# Patient Record
Sex: Male | Born: 1970
Health system: Southern US, Community
[De-identification: ages and names within clinical notes are randomized; demographics above are authoritative.]

## PROBLEM LIST (undated history)

## (undated) DIAGNOSIS — G919 Hydrocephalus, unspecified: Secondary | ICD-10-CM

## (undated) DIAGNOSIS — B1920 Unspecified viral hepatitis C without hepatic coma: Secondary | ICD-10-CM

## (undated) DIAGNOSIS — K219 Gastro-esophageal reflux disease without esophagitis: Secondary | ICD-10-CM

## (undated) DIAGNOSIS — G43909 Migraine, unspecified, not intractable, without status migrainosus: Secondary | ICD-10-CM

## (undated) HISTORY — DX: Migraine, unspecified, not intractable, without status migrainosus: G43.909

## (undated) HISTORY — DX: Gastro-esophageal reflux disease without esophagitis: K21.9

## (undated) HISTORY — DX: Unspecified viral hepatitis C without hepatic coma: B19.20

## (undated) HISTORY — PX: OTHER SURGICAL HISTORY: SHX169

---

## 1999-06-16 ENCOUNTER — Ambulatory Visit (HOSPITAL_COMMUNITY): Admission: RE | Admit: 1999-06-16 | Discharge: 1999-06-16 | Payer: Self-pay | Admitting: *Deleted

## 2000-12-07 ENCOUNTER — Encounter (INDEPENDENT_AMBULATORY_CARE_PROVIDER_SITE_OTHER): Payer: Self-pay | Admitting: *Deleted

## 2000-12-07 ENCOUNTER — Ambulatory Visit: Admission: RE | Admit: 2000-12-07 | Discharge: 2000-12-07 | Payer: Self-pay | Admitting: *Deleted

## 2002-12-19 ENCOUNTER — Encounter: Payer: Self-pay | Admitting: Neurosurgery

## 2002-12-19 ENCOUNTER — Encounter: Admission: RE | Admit: 2002-12-19 | Discharge: 2002-12-19 | Payer: Self-pay | Admitting: Neurosurgery

## 2009-12-17 ENCOUNTER — Encounter: Admission: RE | Admit: 2009-12-17 | Discharge: 2009-12-17 | Payer: Self-pay | Admitting: Gastroenterology

## 2010-11-13 NOTE — Procedures (Signed)
[place]  Patient:    Carlos Costa, Carlos Costa                       MRN: [id number] Proc. Date: 12/07/00 Attending:  Roosvelt Harps, M.D. CC:         Lilly Cove, M.D.   Procedure Report  PROCEDURE:  Liver biopsy.  INDICATIONS FOR PROCEDURE:  Post treatment assessment of hepatitis C.  PREPARATION:  The patient was in the supine position with his right arm below his head. The right side in the low intercostal mid axillary line area was prepped with Betadine. The 1% xylocaine was infiltrated over the rib margin. A 0.5 cm incision was made in the skin and a #16 Menghini needle was used to obtain a fair sized core of liver tissue with one pass with the patient holding his breath and expiration. He tolerated the procedure well. A sterile pressure dressing was applied and he was placed on his right side for two hours. Vital signs will be monitored every 15 minutes. He will be observed for the next four hours and discharged if stable.  IMPRESSION:  Successful liver biopsy.  PLAN:  The patient will return to the office for follow-up discussion of results. DD:  12/07/00 TD:  12/07/00 Job: [id number]

## 2012-11-24 ENCOUNTER — Ambulatory Visit (INDEPENDENT_AMBULATORY_CARE_PROVIDER_SITE_OTHER): Payer: Managed Care, Other (non HMO) | Admitting: Internal Medicine

## 2012-11-24 VITALS — BP 102/72 | HR 71 | Temp 98.0°F | Resp 16 | Ht 65.5 in | Wt 172.0 lb

## 2012-11-24 DIAGNOSIS — B1920 Unspecified viral hepatitis C without hepatic coma: Secondary | ICD-10-CM

## 2012-11-24 DIAGNOSIS — G911 Obstructive hydrocephalus: Secondary | ICD-10-CM

## 2012-11-24 DIAGNOSIS — Z800 Family history of malignant neoplasm of digestive organs: Secondary | ICD-10-CM

## 2012-11-24 DIAGNOSIS — Z0000 Encounter for general adult medical examination without abnormal findings: Secondary | ICD-10-CM

## 2012-11-24 LAB — COMPREHENSIVE METABOLIC PANEL
AST: 17 U/L (ref 0–37)
Chloride: 105 mEq/L (ref 96–112)
Creat: 0.98 mg/dL (ref 0.50–1.35)
Glucose, Bld: 101 mg/dL — ABNORMAL HIGH (ref 70–99)
Potassium: 4.6 mEq/L (ref 3.5–5.3)
Total Protein: 7.2 g/dL (ref 6.0–8.3)

## 2012-11-24 LAB — POCT URINALYSIS DIPSTICK
Bilirubin, UA: NEGATIVE
Ketones, UA: NEGATIVE
Leukocytes, UA: NEGATIVE
pH, UA: 7

## 2012-11-24 LAB — POCT CBC
Lymph, poc: 2.2 (ref 0.6–3.4)
MID (cbc): 0.5 (ref 0–0.9)
POC Granulocyte: 4.5 (ref 2–6.9)
POC LYMPH PERCENT: 30.6 %L (ref 10–50)
POC MID %: 7.4 %M (ref 0–12)
WBC: 7.2 10*3/uL (ref 4.6–10.2)

## 2012-11-24 LAB — LIPID PANEL
Cholesterol: 179 mg/dL (ref 0–200)
Total CHOL/HDL Ratio: 3 Ratio
Triglycerides: 141 mg/dL (ref <150)
VLDL: 28 mg/dL (ref 0–40)

## 2012-11-24 LAB — POCT UA - MICROSCOPIC ONLY
Bacteria, U Microscopic: NEGATIVE
Mucus, UA: NEGATIVE
RBC, urine, microscopic: NEGATIVE
WBC, Ur, HPF, POC: NEGATIVE

## 2012-11-24 NOTE — Patient Instructions (Signed)
DASH Diet  The DASH diet stands for "Dietary Approaches to Stop Hypertension." It is a healthy eating plan that has been shown to reduce high blood pressure (hypertension) in as little as 14 days, while also possibly providing other significant health benefits. These other health benefits include reducing the risk of breast cancer after menopause and reducing the risk of type 2 diabetes, heart disease, colon cancer, and stroke. Health benefits also include weight loss and slowing kidney failure in patients with chronic kidney disease.   DIET GUIDELINES  · Limit salt (sodium). Your diet should contain less than 1500 mg of sodium daily.  · Limit refined or processed carbohydrates. Your diet should include mostly whole grains. Desserts and added sugars should be used sparingly.  · Include small amounts of heart-healthy fats. These types of fats include nuts, oils, and tub margarine. Limit saturated and trans fats. These fats have been shown to be harmful in the body.  CHOOSING FOODS   The following food groups are based on a 2000 calorie diet. See your Registered Dietitian for individual calorie needs.  Grains and Grain Products (6 to 8 servings daily)  · Eat More Often: Whole-wheat bread, brown rice, whole-grain or wheat pasta, quinoa, popcorn without added fat or salt (air popped).  · Eat Less Often: White bread, white pasta, white rice, cornbread.  Vegetables (4 to 5 servings daily)  · Eat More Often: Fresh, frozen, and canned vegetables. Vegetables may be raw, steamed, roasted, or grilled with a minimal amount of fat.  · Eat Less Often/Avoid: Creamed or fried vegetables. Vegetables in a cheese sauce.  Fruit (4 to 5 servings daily)  · Eat More Often: All fresh, canned (in natural juice), or frozen fruits. Dried fruits without added sugar. One hundred percent fruit juice (½ cup [237 mL] daily).  · Eat Less Often: Dried fruits with added sugar. Canned fruit in light or heavy syrup.  Lean Meats, Fish, and Poultry (2  servings or less daily. One serving is 3 to 4 oz [85-114 g]).  · Eat More Often: Ninety percent or leaner ground beef, tenderloin, sirloin. Round cuts of beef, chicken breast, turkey breast. All fish. Grill, bake, or broil your meat. Nothing should be fried.  · Eat Less Often/Avoid: Fatty cuts of meat, turkey, or chicken leg, thigh, or wing. Fried cuts of meat or fish.  Dairy (2 to 3 servings)  · Eat More Often: Low-fat or fat-free milk, low-fat plain or light yogurt, reduced-fat or part-skim cheese.  · Eat Less Often/Avoid: Milk (whole, 2%). Whole milk yogurt. Full-fat cheeses.  Nuts, Seeds, and Legumes (4 to 5 servings per week)  · Eat More Often: All without added salt.  · Eat Less Often/Avoid: Salted nuts and seeds, canned beans with added salt.  Fats and Sweets (limited)  · Eat More Often: Vegetable oils, tub margarines without trans fats, sugar-free gelatin. Mayonnaise and salad dressings.  · Eat Less Often/Avoid: Coconut oils, palm oils, butter, stick margarine, cream, half and half, cookies, candy, pie.  FOR MORE INFORMATION  The Dash Diet Eating Plan: www.dashdiet.org  Document Released: 06/03/2011 Document Revised: 09/06/2011 Document Reviewed: 06/03/2011  Exit[employer]® Patient Information ©2014 [employer].

## 2012-11-24 NOTE — Progress Notes (Signed)
  Subjective:    Patient ID: Carlos Costa, male    DOB: [birth year], 42 y.o.   MRN: [id number]  HPI    Review of Systems  Constitutional: Negative.   HENT: Negative.   Eyes: Negative.   Respiratory: Negative.   Cardiovascular: Negative.   Gastrointestinal: Negative.   Endocrine: Negative.   Genitourinary: Negative.   Musculoskeletal: Negative.   Skin: Negative.   Allergic/Immunologic: Negative.   Neurological: Negative.   Hematological: Negative.   Psychiatric/Behavioral: Negative.        Objective:   Physical Exam        Assessment & Plan:

## 2012-11-24 NOTE — Progress Notes (Signed)
Subjective:    Patient ID: Carlos Costa, male    DOB: 1971-03-15, 42 y.o.   MRN: [id number]  HPI Feels great, continues losing weight. On no meds  No problems   Review of Systems  Constitutional: Negative.   HENT: Negative.   Eyes: Negative.   Respiratory: Negative.   Cardiovascular: Negative.   Gastrointestinal: Negative.   Endocrine: Negative.   Genitourinary: Negative.   Allergic/Immunologic: Negative.   Neurological: Negative.   Hematological: Negative.   Psychiatric/Behavioral: Negative.        Objective:   Physical Exam  Vitals reviewed. Constitutional: He is oriented to person, place, and time. He appears well-developed and well-nourished.  HENT:  Right Ear: External ear normal.  Left Ear: External ear normal.  Nose: Nose normal.  Mouth/Throat: Oropharynx is clear and moist.  Eyes: Conjunctivae and EOM are normal. Pupils are equal, round, and reactive to light.  Neck: Normal range of motion. Neck supple.  Cardiovascular: Normal rate, regular rhythm, normal heart sounds and intact distal pulses.   Pulmonary/Chest: Effort normal and breath sounds normal.  Abdominal: Soft. There is no tenderness.  Genitourinary: Rectum normal, prostate normal and penis normal.  Musculoskeletal: Normal range of motion. He exhibits no edema and no tenderness.  Neurological: He is alert and oriented to person, place, and time. He has normal reflexes. No cranial nerve deficit. He exhibits normal muscle tone. Coordination normal.  Skin: Skin is warm and dry. No rash noted.  Psychiatric: He has a normal mood and affect. His behavior is normal. Judgment and thought content normal.     EKG normal Results for orders placed in visit on 11/24/12  POCT CBC      Result Value Range   WBC 7.2  4.6 - 10.2 K/uL   Lymph, poc 2.2  0.6 - 3.4   POC LYMPH PERCENT 30.6  10 - 50 %L   MID (cbc) 0.5  0 - 0.9   POC MID % 7.4  0 - 12 %M   POC Granulocyte 4.5  2 - 6.9   Granulocyte percent 62.0   37 - 80 %G   RBC 5.39  4.69 - 6.13 M/uL   Hemoglobin 16.0  14.1 - 18.1 g/dL   HCT, POC 40.9  81.1 - 53.7 %   MCV 92.2  80 - 97 fL   MCH, POC 29.7  27 - 31.2 pg   MCHC 32.2  31.8 - 35.4 g/dL   RDW, POC 91.4     Platelet Count, POC 205  142 - 424 K/uL   MPV 10.7  0 - 99.8 fL  POCT URINALYSIS DIPSTICK      Result Value Range   Color, UA yellow     Clarity, UA clear     Glucose, UA neg     Bilirubin, UA neg     Ketones, UA neg     Spec Grav, UA 1.015     Blood, UA neg     pH, UA 7.0     Protein, UA neg     Urobilinogen, UA 0.2     Nitrite, UA neg     Leukocytes, UA Negative    POCT UA - MICROSCOPIC ONLY      Result Value Range   WBC, Ur, HPF, POC neg     RBC, urine, microscopic neg     Bacteria, U Microscopic neg     Mucus, UA neg     Epithelial cells, urine per micros neg  Crystals, Ur, HPF, POC neg     Casts, Ur, LPF, POC neg     Yeast, UA neg         Assessment & Plan:  Hepatitis C/Hydrocephalus with atrial shunt/Fhx colon polps Will contact mothers GI doc Continue diet and weight loss

## 2012-11-29 ENCOUNTER — Encounter: Payer: Self-pay | Admitting: *Deleted

## 2014-01-27 ENCOUNTER — Ambulatory Visit (INDEPENDENT_AMBULATORY_CARE_PROVIDER_SITE_OTHER): Payer: Managed Care, Other (non HMO) | Admitting: Family Medicine

## 2014-01-27 VITALS — BP 120/76 | HR 63 | Temp 98.3°F | Resp 16 | Ht 65.0 in | Wt 184.0 lb

## 2014-01-27 DIAGNOSIS — Z8619 Personal history of other infectious and parasitic diseases: Secondary | ICD-10-CM

## 2014-01-27 DIAGNOSIS — R21 Rash and other nonspecific skin eruption: Secondary | ICD-10-CM

## 2014-01-27 DIAGNOSIS — L739 Follicular disorder, unspecified: Secondary | ICD-10-CM

## 2014-01-27 DIAGNOSIS — L738 Other specified follicular disorders: Secondary | ICD-10-CM

## 2014-01-27 DIAGNOSIS — L678 Other hair color and hair shaft abnormalities: Secondary | ICD-10-CM

## 2014-01-27 LAB — POCT CBC
GRANULOCYTE PERCENT: 73.7 % (ref 37–80)
HEMATOCRIT: 47.7 % (ref 43.5–53.7)
HEMOGLOBIN: 15.9 g/dL (ref 14.1–18.1)
Lymph, poc: 2.8 (ref 0.6–3.4)
MCH: 29.2 pg (ref 27–31.2)
MCHC: 33.4 g/dL (ref 31.8–35.4)
MCV: 87.6 fL (ref 80–97)
MID (cbc): 0.6 (ref 0–0.9)
MPV: 7.5 fL (ref 0–99.8)
PLATELET COUNT, POC: 340 10*3/uL (ref 142–424)
POC Granulocyte: 9.5 — AB (ref 2–6.9)
POC LYMPH PERCENT: 22 %L (ref 10–50)
POC MID %: 4.3 %M (ref 0–12)
RBC: 5.45 M/uL (ref 4.69–6.13)
RDW, POC: 13.5 %
WBC: 12.9 10*3/uL — AB (ref 4.6–10.2)

## 2014-01-27 LAB — POCT SEDIMENTATION RATE: POCT SED RATE: 37 mm/h — AB (ref 0–22)

## 2014-01-27 LAB — RPR

## 2014-01-27 MED ORDER — DOXYCYCLINE HYCLATE 100 MG PO TABS: 100.0000 mg | ORAL_TABLET | Freq: Two times a day (BID) | ORAL | Status: DC

## 2014-01-27 NOTE — Patient Instructions (Signed)
We will start antibiotic for possible folliculitis or bacterial infection of these areas. Wash areas daily with soap and water and launder all towels/washcloth and bedclothes tonight.   recheck with Dr. Carlota Raspberry in 2 days - Tuesday from 8:30-3pm.   Return to the clinic or go to the nearest emergency room if any of your symptoms worsen or new symptoms occur.  Folliculitis  Folliculitis is redness, soreness, and swelling (inflammation) of the hair follicles. This condition can occur anywhere on the body. People with weakened immune systems, diabetes, or obesity have a greater risk of getting folliculitis. CAUSES  Bacterial infection. This is the most common cause.  Fungal infection.  Viral infection.  Contact with certain chemicals, especially oils and tars. Long-term folliculitis can result from bacteria that live in the nostrils. The bacteria may trigger multiple outbreaks of folliculitis over time. SYMPTOMS Folliculitis most commonly occurs on the scalp, thighs, legs, back, buttocks, and areas where hair is shaved frequently. An early sign of folliculitis is a small, white or yellow, pus-filled, itchy lesion (pustule). These lesions appear on a red, inflamed follicle. They are usually less than 0.2 inches (5 mm) wide. When there is an infection of the follicle that goes deeper, it becomes a boil or furuncle. A group of closely packed boils creates a larger lesion (carbuncle). Carbuncles tend to occur in hairy, sweaty areas of the body. DIAGNOSIS  Your caregiver can usually tell what is wrong by doing a physical exam. A sample may be taken from one of the lesions and tested in a lab. This can help determine what is causing your folliculitis. TREATMENT  Treatment may include:  Applying warm compresses to the affected areas.  Taking antibiotic medicines orally or applying them to the skin.  Draining the lesions if they contain a large amount of pus or fluid.  Laser hair removal for cases  of long-lasting folliculitis. This helps to prevent regrowth of the hair. HOME CARE INSTRUCTIONS  Apply warm compresses to the affected areas as directed by your caregiver.  If antibiotics are prescribed, take them as directed. Finish them even if you start to feel better.  You may take over-the-counter medicines to relieve itching.  Do not shave irritated skin.  Follow up with your caregiver as directed. SEEK IMMEDIATE MEDICAL CARE IF:   You have increasing redness, swelling, or pain in the affected area.  You have a fever. MAKE SURE YOU:  Understand these instructions.  Will watch your condition.  Will get help right away if you are not doing well or get worse. Document Released: 08/23/2001 Document Revised: 12/14/2011 Document Reviewed: 09/14/2011 [facility] Patient Information 2015 [place], [place]. This information is not intended to replace advice given to you by your health care provider. Make sure you discuss any questions you have with your health care provider.

## 2014-01-27 NOTE — Progress Notes (Addendum)
This chart was scribed for Carlos Ray, MD by Thea Alken, ED Scribe. This patient was seen in room 12 and the patient's care was started at 9:19 AM. Subjective:    Patient ID: Carlos Costa, male    DOB: December 07, 1970, 43 y.o.   MRN: [id number]  Rash Pertinent negatives include no fever.   Chief Complaint  Patient presents with  . Rash    Hand & Back of Head   HPI Comments: Carlos Costa is a 43 y.o. male who presents to the [facility] complaining of worsening rash to bilateral hands and posterior scalp that began 4 days ago. Pt reports rash started out small to palmer surface of hands. He describes rash as being uncomfortable with mild itch. Pt reports applying moisturizer to hands without relief to rash. Pt reports a cold last week that lasted 3 days. Pt denies similar rash in the past. Pt denies rash to genital area and mouth. Pt denies new lotion, creams, soaps and medication. Pt denies recent hot tube and pool use. He denies h/o STD's. Pt denies new partners. 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  Pt denies touching parts and wearing gloves while working. Pt denies working outside. Pt reports wife has recently had a radiation treatment. Pt denies fevers.  Liver function was normal in May 2015. Pt has h./o Hep C treated in 2001 with interferon and ribavirin.  There are no active problems to display for this patient.  Past Medical History  Diagnosis Date  . GERD (gastroesophageal reflux disease)   . Hepatitis C    Past Surgical History  Procedure Laterality Date  . Hydrocephalus     No Known Allergies Prior to Admission medications   Medication Sig Start Date End Date Taking? Authorizing Provider  Cholecalciferol (VITAMIN D PO) Take 1 tablet by mouth daily.   Yes Historical Provider, MD  glucosamine-chondroitin 500-400 MG tablet Take 1 tablet by mouth 3 (three) times daily.   Yes Historical Provider, MD  Multiple  Vitamins-Minerals (MULTIVITAMIN WITH MINERALS) tablet Take 1 tablet by mouth daily.   Yes Historical Provider, MD   History   Social History  . Marital Status: Single    Spouse Name: N/A    Number of Children: N/A  . Years of Education: N/A   Occupational History  . Not on file.   Social History Main Topics  . Smoking status: Never Smoker   . Smokeless tobacco: Former Systems developer  . Alcohol Use: Yes  . Drug Use: No  . Sexual Activity: Yes   Other Topics Concern  . Not on file   Social History Narrative  . No narrative on file   Review of Systems  Constitutional: Negative for fever and chills.  HENT: Negative for mouth sores.   Genitourinary: Positive for genital sores.  Skin: Positive for rash.    Objective:   Physical Exam  Nursing note and vitals reviewed. Constitutional: He is oriented to person, place, and time. He appears well-developed and well-nourished.  Non-toxic appearance. He does not have a sickly appearance. He does not appear ill. No distress.  HENT:  Head: Normocephalic and atraumatic.  Mouth/Throat: Oropharynx is clear and moist. No oropharyngeal exudate.  Top of scalp and occipital region with multiple erythematous papules and pustules.  No oral  lesions or vesicles.  Neck: Normal range of motion. Neck supple.  Cardiovascular: Normal rate, regular rhythm and normal heart sounds.  Exam reveals no  gallop and no friction rub.   No murmur heard. Pulmonary/Chest: Effort normal and breath sounds normal. No respiratory distress. He has no wheezes. He has no rales. He exhibits no tenderness.  Neurological: He is alert and oriented to person, place, and time. Coordination abnormal.  Skin: Skin is warm and dry. Rash noted. Rash is papular and pustular. He is not diaphoretic.  Hands - palmer aspect much greater than distal finger with diffuse pustules with white base as well as petechiae diffusely to palmer hands but does not extend to wrist. Multiple lesions between  fingers distally.  Right foot and left foot base he has a few scattered pustules, and a few flat clear vesicles, no petechiae.  Upper chest and lower back with a few small pustules. One pustule to right lateral neck.  Psychiatric: He has a normal mood and affect. His behavior is normal.   Results for orders placed in visit on 01/27/14  POCT CBC      Result Value Ref Range   WBC 12.9 (*) 4.6 - 10.2 K/uL   Lymph, poc 2.8  0.6 - 3.4   POC LYMPH PERCENT 22.0  10 - 50 %L   MID (cbc) 0.6  0 - 0.9   POC MID % 4.3  0 - 12 %M   POC Granulocyte 9.5 (*) 2 - 6.9   Granulocyte percent 73.7  37 - 80 %G   RBC 5.45  4.69 - 6.13 M/uL   Hemoglobin 15.9  14.1 - 18.1 g/dL   HCT, POC 47.7  43.5 - 53.7 %   MCV 87.6  80 - 97 fL   MCH, POC 29.2  27 - 31.2 pg   MCHC 33.4  31.8 - 35.4 g/dL   RDW, POC 13.5     Platelet Count, POC 340  142 - 424 K/uL   MPV 7.5  0 - 99.8 fL     Assessment & Plan:   Carlos Costa is a 43 y.o. male Rash of hands - Plan: POCT CBC, RPR, POCT SEDIMENTATION RATE, Wound culture  Rash and nonspecific skin eruption - Plan: POCT CBC, RPR, POCT SEDIMENTATION RATE, Wound culture  History of hepatitis C - Plan: POCT CBC, RPR, POCT SEDIMENTATION RATE, Wound culture  Folliculitis - Plan: doxycycline (VIBRA-TABS) 100 MG tablet  Palmar and plantar rash, but also with scalp involvement, and a few truncal lesions. Mildly elevated WBC on CBC.  ESR pending, DDX includes folliculitis, viral syndrome such as coxsackie, but scalp involvement less likely, and possible E.multiforme.  No known contact derm risk factors. No new meds.  Does not appear ill, and afebrile. No mucosal lesions. Less likely syphilis with scalp involvement, but will check RPR.   -ESR, RPR, and wound culture pending.   -start doxycycline  -recheck in 48 hours, sooner or to ER if worse.   Meds ordered this encounter  Medications  . Cholecalciferol (VITAMIN D PO)    Sig: Take 1 tablet by mouth daily.  Marland Kitchen doxycycline  (VIBRA-TABS) 100 MG tablet    Sig: Take 1 tablet (100 mg total) by mouth 2 (two) times daily.    Dispense:  20 tablet    Refill:  0   Patient Instructions  We will start antibiotic for possible folliculitis or bacterial infection of these areas. Wash areas daily with soap and water and launder all towels/washcloth and bedclothes tonight.   recheck with Dr. Carlota Raspberry in 2 days - Tuesday from 8:30-3pm.   Return to the clinic or go  to the nearest emergency room if any of your symptoms worsen or new symptoms occur.  Folliculitis  Folliculitis is redness, soreness, and swelling (inflammation) of the hair follicles. This condition can occur anywhere on the body. People with weakened immune systems, diabetes, or obesity have a greater risk of getting folliculitis. CAUSES  Bacterial infection. This is the most common cause.  Fungal infection.  Viral infection.  Contact with certain chemicals, especially oils and tars. Long-term folliculitis can result from bacteria that live in the nostrils. The bacteria may trigger multiple outbreaks of folliculitis over time. SYMPTOMS Folliculitis most commonly occurs on the scalp, thighs, legs, back, buttocks, and areas where hair is shaved frequently. An early sign of folliculitis is a small, white or yellow, pus-filled, itchy lesion (pustule). These lesions appear on a red, inflamed follicle. They are usually less than 0.2 inches (5 mm) wide. When there is an infection of the follicle that goes deeper, it becomes a boil or furuncle. A group of closely packed boils creates a larger lesion (carbuncle). Carbuncles tend to occur in hairy, sweaty areas of the body. DIAGNOSIS  Your caregiver can usually tell what is wrong by doing a physical exam. A sample may be taken from one of the lesions and tested in a lab. This can help determine what is causing your folliculitis. TREATMENT  Treatment may include:  Applying warm compresses to the affected areas.  Taking  antibiotic medicines orally or applying them to the skin.  Draining the lesions if they contain a large amount of pus or fluid.  Laser hair removal for cases of long-lasting folliculitis. This helps to prevent regrowth of the hair. HOME CARE INSTRUCTIONS  Apply warm compresses to the affected areas as directed by your caregiver.  If antibiotics are prescribed, take them as directed. Finish them even if you start to feel better.  You may take over-the-counter medicines to relieve itching.  Do not shave irritated skin.  Follow up with your caregiver as directed. SEEK IMMEDIATE MEDICAL CARE IF:   You have increasing redness, swelling, or pain in the affected area.  You have a fever. MAKE SURE YOU:  Understand these instructions.  Will watch your condition.  Will get help right away if you are not doing well or get worse. Document Released: 08/23/2001 Document Revised: 12/14/2011 Document Reviewed: 09/14/2011 [facility] Patient Information 2015 [place], [place]. This information is not intended to replace advice given to you by your health care provider. Make sure you discuss any questions you have with your health care provider.        I personally performed the services described in this documentation, which was scribed in my presence. The recorded information has been reviewed and considered, and addended by me as needed.

## 2014-01-29 ENCOUNTER — Ambulatory Visit (INDEPENDENT_AMBULATORY_CARE_PROVIDER_SITE_OTHER): Payer: Managed Care, Other (non HMO) | Admitting: Family Medicine

## 2014-01-29 ENCOUNTER — Encounter: Payer: Self-pay | Admitting: Family Medicine

## 2014-01-29 VITALS — BP 120/72 | HR 72 | Temp 97.8°F | Resp 12 | Ht 66.0 in | Wt 184.0 lb

## 2014-01-29 DIAGNOSIS — L403 Pustulosis palmaris et plantaris: Secondary | ICD-10-CM

## 2014-01-29 DIAGNOSIS — L408 Other psoriasis: Secondary | ICD-10-CM

## 2014-01-29 DIAGNOSIS — R21 Rash and other nonspecific skin eruption: Secondary | ICD-10-CM | POA: Insufficient documentation

## 2014-01-29 LAB — WOUND CULTURE
GRAM STAIN: NONE SEEN
GRAM STAIN: NONE SEEN
ORGANISM ID, BACTERIA: NO GROWTH

## 2014-01-29 MED ORDER — CLOBETASOL PROPIONATE 0.05 % EX CREA: 1.0000 "application " | TOPICAL_CREAM | Freq: Two times a day (BID) | CUTANEOUS | Status: DC

## 2014-01-29 NOTE — Progress Notes (Signed)
VCO. Local anesthesia with 1% lidocaine with epinephrine. Betadine prep and SP. 3 mm punch biopsy obtained and placed in pathology container.  Repaired with #1 SI suture. Hemostasis obtained. Cleaned and bandaged. Patient tolerated well.

## 2014-01-29 NOTE — Patient Instructions (Addendum)
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 We can start with clobetasol steroid cream, up to twice per day (can apply and place saran wrap over treated area at night).  Recheck in next 10-14 days. Return to the clinic or go to the nearest emergency room if any of your symptoms worsen or new symptoms occur. Psoriasis Psoriasis is a common, long-lasting (chronic) inflammation of the skin. It affects both men and women equally, of all ages and all races. Psoriasis cannot be passed from person to person (not contagious). Psoriasis varies from mild to very severe. When severe, it can greatly affect your quality of life. Psoriasis is an inflammatory disorder affecting the skin as well as other organs including the joints (causing an arthritis). With psoriasis, the skin sheds its top layer of cells more rapidly than it does in someone without psoriasis. CAUSES  The cause of psoriasis is largely unknown. Genetics, your immune system, and the environment seem to play a role in causing psoriasis. Factors that can make psoriasis worse include:  Damage or trauma to the skin, such as cuts, scrapes, and sunburn. This damage often causes new areas of psoriasis (lesions).  Winter dryness and lack of sunlight.  Medicines such as lithium, beta-blockers, antimalarial drugs, ACE inhibitors, nonsteroidal anti-inflammatory drugs (ibuprofen, aspirin), and terbinafine. Let your caregiver know if you are taking any of these drugs.  Alcohol. Excessive alcohol use should be avoided if you have psoriasis. Drinking large amounts of alcohol can affect:  How well your psoriasis treatment works.  How safe your psoriasis treatment is.  Smoking. If you smoke, ask your caregiver for help to quit.  Stress.  Bacterial or viral infections.  Arthritis. Arthritis associated with psoriasis (psoriatic arthritis) affects less than 10% of patients with psoriasis. The arthritic intensity does not always match the skin psoriasis  intensity. It is important to let your caregiver know if your joints hurt or if they are stiff. SYMPTOMS  The most common form of psoriasis begins with little red bumps that gradually become larger. The bumps begin to form scales that flake off easily. The lower layers of scales stick together. When these scales are scratched or removed, the underlying skin is tender and bleeds easily. These areas then grow in size and may become large. Psoriasis often creates a rash that looks the same on both sides of the body (symmetrical). It often affects the elbows, knees, groin, genitals, arms, legs, scalp, and nails. Affected nails often have pitting, loosen, thicken, crumble, and are difficult to treat.  "Inverse psoriasis"occurs in the armpits, under breasts, in skin folds, and around the groin, buttocks, and genitals.  "Guttate psoriasis" generally occurs in children and young adults following a recent sore throat (strep throat). It begins with many small, red, scaly spots on the skin. It clears spontaneously in weeks or a few months without treatment. DIAGNOSIS  Psoriasis is diagnosed by physical exam. A tissue sample (biopsy) may also be taken. TREATMENT The treatment of psoriasis depends on your age, health, and living conditions.  Steroid (cortisone) creams, lotions, and ointments may be used. These treatments are associated with thinning of the skin, blood vessels that get larger (dilated), loss of skin pigmentation, and easy bruising. It is important to use these steroids as directed by your caregiver. Only treat the affected areas and not the normal, unaffected skin. People on long-term steroid treatment should wear a medical alert bracelet. Injections may be used in areas that are difficult to treat.  Scalp treatments are  available as shampoos, solutions, sprays, foams, and oils. Avoid scratching the scalp and picking at the scales.  Anthralin medicine works well on areas that are difficult to  treat. However, it stains clothes and skin and may cause temporary irritation.  Synthetic vitamin D (calcipotriene)can be used on small areas. It is available by prescription. The forms of synthetic vitamin D available in health food stores do not help with psoriasis.  Coal tarsare available in various strengths for psoriasis that is difficult to treat. They are one of the longest used treatments for difficult to treat psoriasis. However, they are messy to use.  Light therapy (UV therapy) can be carefully and professionally monitored in a dermatologist's office. Careful sunbathing is helpful for many people as directed by your caregiver. The exposure should be just long enough to cause a mild redness (erythema) of your skin. Avoid sunburn as this may make the condition worse. Sunscreen (SPF of 30 or higher) should be used to protect against sunburn. Cataracts, wrinkles, and skin aging are some of the harmful side effects of light therapy.  If creams (topical medicines) fail, there are several other options for systemic or oral medicines your caregiver can suggest. Psoriasis can sometimes be very difficult to treat. It can come and go. It is necessary to follow up with your caregiver regularly if your psoriasis is difficult to treat. Usually, with persistence you can get a good amount of relief. Maintaining consistent care is important. Do not change caregivers just because you do not see immediate results. It may take several trials to find the right combination of treatment for you. PREVENTING FLARE-UPS  Wear gloves while you wash dishes, while cleaning, and when you are outside in the cold.  If you have radiators, place a bowl of water or damp towel on the radiator. This will help put water back in the air. You can also use a humidifier to keep the air moist. Try to keep the humidity at about 60% in your home.  Apply moisturizer while your skin is still damp from bathing or showering. This traps  water in the skin.  Avoid long, hot baths or showers. Keep soap use to a minimum. Soaps dry out the skin and wash away the protective oils. Use a fragrance free, dye free soap.  Drink enough water and fluids to keep your urine clear or pale yellow. Not drinking enough water depletes your skin's water supply.  Turn off the heat at night and keep it low during the day. Cool air is less drying. SEEK MEDICAL CARE IF:  You have increasing pain in the affected areas.  You have uncontrolled bleeding in the affected areas.  You have increasing redness or warmth in the affected areas.  You start to have pain or stiffness in your joints.  You start feeling depressed about your condition.  You have a fever. Document Released: 06/11/2000 Document Revised: 09/06/2011 Document Reviewed: 12/07/2010 [facility] Patient Information 2015 [place], [place]. This information is not intended to replace advice given to you by your health care provider. Make sure you discuss any questions you have with your health care provider.

## 2014-01-29 NOTE — Assessment & Plan Note (Addendum)
Pt with multiple lesions that now include pustules.  DDx includes E. Multiforme, pustular psoriasis, eczematous, dyshidrotic eczema, RMSF, vasculitis (autoimmune including HSP, Cryoglobulin (Hx of Hep C), Buerger's disease), contact, Bechet's.  However, syphilis (RPR negative), ESR only elevated to 37, and CBC elevated to 12.9.  No other areas on his body and no systemic symptoms.  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  Started clobetasol topically.

## 2014-01-29 NOTE — Progress Notes (Signed)
Carlos Costa is a 43 y.o. male who presents today for follow up for rash on palmar/plantar aspect.  Pt was seen about two days ago now for rash on hands/feet that began about 4-5 days ago now.  Pt had these macular areas on his hands erupt at that time that have transitioned into papules/pustules diffuse on his B/L hands and now have included small macular regions of the plantar aspect of his feet.  At the time of his visit two days ago, ESR (37), CBC (12.9), Wound Cx (negative), and RPR (negative) were performed at that time and was started on doxycycline for superimposed cellulitic infection concern and has not noticed too much of a difference.  He denies any recent new soaps, detergents, work exposure (does not handle different chemicals), has not had recent travel, recent sick contacts, recent tick exposure.  Does endorse quite a bit of pruritis of his hands but denies any fever, chills, sweats, weakness, fatigue, HA, blurred vision, diplopia, N/V/D, CP, SOB.    Past Medical History  Diagnosis Date  . GERD (gastroesophageal reflux disease)   . Hepatitis C     History  Smoking status  . Never Smoker   Smokeless tobacco  . Former Systems developer    Family History  Problem Relation Age of Onset  . Arthritis Mother   . Cancer Maternal Grandmother   . Cancer Maternal Grandfather     Current Outpatient Prescriptions on File Prior to Visit  Medication Sig Dispense Refill  . Cholecalciferol (VITAMIN D PO) Take 1 tablet by mouth daily.      Marland Kitchen doxycycline (VIBRA-TABS) 100 MG tablet Take 1 tablet (100 mg total) by mouth 2 (two) times daily.  20 tablet  0  . glucosamine-chondroitin 500-400 MG tablet Take 1 tablet by mouth 3 (three) times daily.      . Multiple Vitamins-Minerals (MULTIVITAMIN WITH MINERALS) tablet Take 1 tablet by mouth daily.       No current facility-administered medications on file prior to visit.    ROS: Per HPI.  All other systems reviewed and are negative.   Physical  Exam Filed Vitals:   01/29/14 1036  BP: 120/72  Pulse: 72  Temp: 97.8 F (36.6 C)  Resp: 12    Physical Examination: General appearance - alert, well appearing, and in no distress Extremities - no pedal edema noted, intact peripheral pulses Skin - + multiple macules on palmar aspect of hands along with multiple pustules/papules B/L along with some small papules on the plantar aspect of his feet B/L.  Does have palpable purupra B/L Palmar regions as well.  No target lesions present, no oral involvement present or mucous membrane involvement.     Chemistry      Component Value Date/Time   NA 141 11/24/2012 0940   K 4.6 11/24/2012 0940   CL 105 11/24/2012 0940   CO2 26 11/24/2012 0940   BUN 13 11/24/2012 0940   CREATININE 0.98 11/24/2012 0940      Component Value Date/Time   CALCIUM 9.8 11/24/2012 0940   ALKPHOS 74 11/24/2012 0940   AST 17 11/24/2012 0940   ALT 14 11/24/2012 0940   BILITOT 0.7 11/24/2012 0940      Lab Results  Component Value Date   WBC 12.9* 01/27/2014   HGB 15.9 01/27/2014   HCT 47.7 01/27/2014   MCV 87.6 01/27/2014   Wound Cx - Negative  RPR - Negative   ESR - 37

## 2014-01-30 NOTE — Progress Notes (Signed)
Patient seen in conjunction with D.r Hess.  Agree with findings in his note. Pustular lesions with purpuric/petechial appearing areas - where bleeding into pustules has occurred. No systemic symptoms. 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 Will start with topical clobetasol QD-BID (under occlusive saran at bedtime if needed), and punch biopsy obtained for confirmation. Recheck in next 2 weeks to determine if improving or if will need to see dermatology for eval of other treatments. rtc sooner if any spread or worsening.

## 2017-10-24 ENCOUNTER — Emergency Department (HOSPITAL_COMMUNITY)
Admission: EM | Admit: 2017-10-24 | Discharge: 2017-10-24 | Disposition: A | Discharge status: Home / Self Care | Payer: Managed Care, Other (non HMO) | Attending: Emergency Medicine | Admitting: Emergency Medicine

## 2017-10-24 ENCOUNTER — Emergency Department (HOSPITAL_COMMUNITY): Payer: Managed Care, Other (non HMO)

## 2017-10-24 ENCOUNTER — Encounter (HOSPITAL_COMMUNITY): Payer: Self-pay | Admitting: Emergency Medicine

## 2017-10-24 DIAGNOSIS — Z982 Presence of cerebrospinal fluid drainage device: Secondary | ICD-10-CM | POA: Insufficient documentation

## 2017-10-24 DIAGNOSIS — Z87891 Personal history of nicotine dependence: Secondary | ICD-10-CM | POA: Diagnosis not present

## 2017-10-24 DIAGNOSIS — R112 Nausea with vomiting, unspecified: Secondary | ICD-10-CM | POA: Diagnosis not present

## 2017-10-24 DIAGNOSIS — Z79899 Other long term (current) drug therapy: Secondary | ICD-10-CM | POA: Insufficient documentation

## 2017-10-24 DIAGNOSIS — Q039 Congenital hydrocephalus, unspecified: Secondary | ICD-10-CM | POA: Insufficient documentation

## 2017-10-24 DIAGNOSIS — R519 Headache, unspecified: Secondary | ICD-10-CM

## 2017-10-24 DIAGNOSIS — R51 Headache: Secondary | ICD-10-CM | POA: Insufficient documentation

## 2017-10-24 HISTORY — DX: Hydrocephalus, unspecified: G91.9

## 2017-10-24 LAB — CBC WITH DIFFERENTIAL/PLATELET
Basophils Absolute: 0 10*3/uL (ref 0.0–0.1)
Basophils Relative: 0 %
Eosinophils Absolute: 0 10*3/uL (ref 0.0–0.7)
Eosinophils Relative: 0 %
HCT: 46 % (ref 39.0–52.0)
HEMOGLOBIN: 16 g/dL (ref 13.0–17.0)
LYMPHS PCT: 6 %
Lymphs Abs: 0.8 10*3/uL (ref 0.7–4.0)
MCH: 29.8 pg (ref 26.0–34.0)
MCHC: 34.8 g/dL (ref 30.0–36.0)
MCV: 85.7 fL (ref 78.0–100.0)
MONOS PCT: 4 %
Monocytes Absolute: 0.6 10*3/uL (ref 0.1–1.0)
NEUTROS PCT: 90 %
Neutro Abs: 12.8 10*3/uL — ABNORMAL HIGH (ref 1.7–7.7)
Platelets: 247 10*3/uL (ref 150–400)
RBC: 5.37 MIL/uL (ref 4.22–5.81)
RDW: 13.8 % (ref 11.5–15.5)
WBC: 14.2 10*3/uL — AB (ref 4.0–10.5)

## 2017-10-24 LAB — BASIC METABOLIC PANEL
ANION GAP: 15 (ref 5–15)
BUN: 11 mg/dL (ref 6–20)
CHLORIDE: 94 mmol/L — AB (ref 101–111)
CO2: 29 mmol/L (ref 22–32)
Calcium: 11.2 mg/dL — ABNORMAL HIGH (ref 8.9–10.3)
Creatinine, Ser: 1.1 mg/dL (ref 0.61–1.24)
GFR calc Af Amer: >60 mL/min (ref >60)
GFR calc non Af Amer: >60 mL/min (ref >60)
Glucose, Bld: 170 mg/dL — ABNORMAL HIGH (ref 65–99)
Potassium: 3.5 mmol/L (ref 3.5–5.1)
Sodium: 138 mmol/L (ref 135–145)

## 2017-10-24 MED ORDER — ONDANSETRON HCL 4 MG/2ML IJ SOLN
4.0000 mg | Freq: Once | INTRAMUSCULAR | Status: AC
  Administered 2017-10-24: 4 mg via INTRAVENOUS
  Filled 2017-10-24: qty 2

## 2017-10-24 MED ORDER — METOCLOPRAMIDE HCL 5 MG/ML IJ SOLN
10.0000 mg | Freq: Once | INTRAMUSCULAR | Status: AC
  Administered 2017-10-24: 10 mg via INTRAVENOUS
  Filled 2017-10-24: qty 2

## 2017-10-24 MED ORDER — VALPROATE SODIUM 500 MG/5ML IV SOLN
500.0000 mg | Freq: Once | INTRAVENOUS | Status: AC
  Administered 2017-10-24: 500 mg via INTRAVENOUS
  Filled 2017-10-24 (×2): qty 5

## 2017-10-24 MED ORDER — HYDROCODONE-ACETAMINOPHEN 5-325 MG PO TABS: 2.0000 | ORAL_TABLET | ORAL | 0 refills | Status: DC | PRN

## 2017-10-24 MED ORDER — ONDANSETRON HCL 4 MG/2ML IJ SOLN: 4.0000 mg | Freq: Once | INTRAMUSCULAR | Status: DC | PRN

## 2017-10-24 MED ORDER — ONDANSETRON 4 MG PO TBDP
4.0000 mg | ORAL_TABLET | Freq: Once | ORAL | Status: AC
  Administered 2017-10-24: 4 mg via ORAL
  Filled 2017-10-24: qty 1

## 2017-10-24 MED ORDER — MORPHINE SULFATE (PF) 4 MG/ML IV SOLN
4.0000 mg | INTRAVENOUS | Status: DC | PRN
  Administered 2017-10-24: 4 mg via INTRAVENOUS
  Filled 2017-10-24: qty 1

## 2017-10-24 MED ORDER — ONDANSETRON 4 MG PO TBDP: 4.0000 mg | ORAL_TABLET | Freq: Three times a day (TID) | ORAL | 0 refills | Status: DC | PRN

## 2017-10-24 MED ORDER — HYDROMORPHONE HCL 2 MG/ML IJ SOLN: 1.0000 mg | INTRAMUSCULAR | Status: DC | PRN

## 2017-10-24 MED ORDER — DIPHENHYDRAMINE HCL 50 MG/ML IJ SOLN
25.0000 mg | Freq: Once | INTRAMUSCULAR | Status: AC
  Administered 2017-10-24: 25 mg via INTRAVENOUS
  Filled 2017-10-24: qty 1

## 2017-10-24 NOTE — ED Notes (Signed)
Pt family to desk stating pt is beginning to feel more nauseous. This RN consulted with Raquel Sarna, [place] about patient scans/results. Family updated. Will give ODT zofran.

## 2017-10-24 NOTE — ED Notes (Signed)
PA Harris made aware of patient's symptoms, this RN requested orders as appropriate.

## 2017-10-24 NOTE — ED Triage Notes (Signed)
Pt reports severe headache with n/v that began on Friday. Pt has hydrocephallic shunt. Denies any recent head injuries. Pt a/ox4, resp e/u, nad.

## 2017-10-24 NOTE — ED Provider Notes (Signed)
[facility] EMERGENC[facility] DEPARTMENT Provider Note   CSN: [id number] Arrival date & time: 10/24/17  0755     History   Chief Complaint Chief Complaint  Patient presents with  . Headache  . Nausea    HPI Carlos Costa is a 47 y.o. male.  Chief complaint is headache, history of V-a shunt  HPI : 47 year old male.  History of congenital hydrocephalus.  Initial shunt performed at age 34 months.  His last revision was in 1988 at [facility].  He has not had any difficulty with headaches or shunt malfunction symptoms since that time.  He had an intake evaluation with Dr. Sherwood Gambler here "about 10 years ago" per his report.  He states that his CT scan was normal.  He has not had to follow with a surgeon since that time.  He states that on Saturday, 2 days ago he fell asleep.  He awakened at midnight with a headache.  It was rather severe.  He has kept him from sleeping since that time.  Today is Monday afternoon.  He describes an ongoing 8 out of 10 headache.  States he feels fatigued.  He relates this to not sleeping.  He has had nausea and vomiting for the last 2 days as well.  He has not had fever shakes or chills.  He has had no vision changes.  He has normal gait.  He does not notice vision changes weakness of the arms or legs or personality, or cognition changes.   CT Head 12/19/2002: Study Result FINDINGS CLINICAL DATA: HEADACHES. EVALUATE SHUNT. CT OF BRAIN WITHOUT CONTRAST: AXIAL SCANS FROM THE BASE OF THE VERTEX WERE PERFORMED IN THE UNENHANCED STATE. THE VENTRICULAR SYSTEM IS NORMAL IN SIZE AND CONFIGURATION. A VENTRICULAR SHUNT IS PRESENT FROM THE LEFT POSTERIOR PARIETAL INSERTION WITH THE TIP IN THE LEFT ANTERIOR HORN OF THE LATERAL VENTRICLE. NO ACUTE INTRACRANIAL ABNORMALITY IS SEEN. ON BONE WINDOW IMAGES, NO ACUTE BONY ABNORMALITY IS SEEN. BURR HOLE IN THE RIGHT FRONTOPARIETAL REGION IS NOTED. IMPRESSION VENTRICULAR CATHETER IN GOOD POSITION  WITH VENTRICLES NORMAL IN SIZE. NO ACUTE INTRACRANIAL ABNORMALITY IS SEEN   Past Medical History:  Diagnosis Date  . GERD (gastroesophageal reflux disease)   . Hepatitis C   . Hydrocephalus     Patient Active Problem List   Diagnosis Date Noted  . Localized maculopapular rash 01/29/2014    Past Surgical History:  Procedure Laterality Date  . hydrocephalus          Home Medications    Prior to Admission medications   Medication Sig Start Date End Date Taking? Authorizing Provider  Cholecalciferol (VITAMIN D PO) Take 1,000 Units by mouth daily.    Yes [provider]  glucosamine-chondroitin 500-400 MG tablet Take 2 tablets by mouth 2 (two) times daily.    Yes [provider]  Multiple Vitamins-Minerals (MULTIVITAMIN WITH MINERALS) tablet Take 1 tablet by mouth daily.   Yes [provider]  clobetasol cream (TEMOVATE) 0.63 % Apply 1 application topically 2 (two) times daily. Patient not taking: Reported on 10/24/2017 01/29/14   Wendie Agreste, MD  doxycycline (VIBRA-TABS) 100 MG tablet Take 1 tablet (100 mg total) by mouth 2 (two) times daily. Patient not taking: Reported on 10/24/2017 01/27/14   Wendie Agreste, MD  HYDROcodone-acetaminophen (NORCO/VICODIN) 5-325 MG tablet Take 2 tablets by mouth every 4 (four) hours as needed. 10/24/17   Tanna Furry, MD  ondansetron (ZOFRAN ODT) 4 MG disintegrating tablet Take  1 tablet (4 mg total) by mouth every 8 (eight) hours as needed for nausea. 10/24/17   Tanna Furry, MD    Family History Family History  Problem Relation Age of Onset  . Arthritis Mother   . Cancer Maternal Grandmother   . Cancer Maternal Grandfather     Social History Social History   Tobacco Use  . Smoking status: Never Smoker  . Smokeless tobacco: Former Network engineer Use Topics  . Alcohol use: Yes  . Drug use: No     Allergies   Patient has no known allergies.   Review of Systems Review of Systems  Constitutional:  Negative for appetite change, chills, diaphoresis, fatigue and fever.  HENT: Negative for mouth sores, sore throat and trouble swallowing.   Eyes: Negative for visual disturbance.  Respiratory: Negative for cough, chest tightness, shortness of breath and wheezing.   Cardiovascular: Negative for chest pain.  Gastrointestinal: Positive for nausea and vomiting. Negative for abdominal distention, abdominal pain and diarrhea.  Endocrine: Negative for polydipsia, polyphagia and polyuria.  Genitourinary: Negative for dysuria, frequency and hematuria.  Musculoskeletal: Negative for gait problem.  Skin: Negative for color change, pallor and rash.  Neurological: Positive for headaches. Negative for dizziness, syncope and light-headedness.       He denies incontinence.  Hematological: Does not bruise/bleed easily.  Psychiatric/Behavioral: Negative for behavioral problems and confusion.     Physical Exam Updated Vital Signs BP 134/80   Pulse 75   Temp 97.9 F (36.6 C) (Oral)   Resp 18   SpO2 93%   Physical Exam  Constitutional: He is oriented to person, place, and time. He appears well-developed and well-nourished. No distress.  HENT:  Head: Normocephalic.  Nontender.  No abnormalities of the skin over the course of his VP shunt in the parietal scalp.  Eyes: Pupils are equal, round, and reactive to light. Conjunctivae are normal. No scleral icterus.  No papilledema.  Normal extraocular movement.  No limitation of upward movement of the eyes.  Neck: Normal range of motion. Neck supple. No thyromegaly present.  Cardiovascular: Normal rate and regular rhythm. Exam reveals no gallop and no friction rub.  No murmur heard. Pulmonary/Chest: Effort normal and breath sounds normal. No respiratory distress. He has no wheezes. He has no rales.  Abdominal: Soft. Bowel sounds are normal. He exhibits no distension. There is no tenderness. There is no rebound.  Musculoskeletal: Normal range of motion.    Neurological: He is alert and oriented to person, place, and time.  Symmetric cranial nerves.  No pronator drift.  No leg drift.  Negative Romberg's.  1+ upper extremity reflexes.  Lower extremity reflexes: Knee jerk 2+, Achilles 2+ with 2 beats clonus symmetric.  Skin: Skin is warm and dry. No rash noted.  Psychiatric: He has a normal mood and affect. His behavior is normal.     ED Treatments / Results  Labs (all labs ordered are listed, but only abnormal results are displayed) Labs Reviewed  BASIC METABOLIC PANEL - Abnormal; Notable for the following components:      Result Value   Chloride 94 (*)    Glucose, Bld 170 (*)    Calcium 11.2 (*)    All other components within normal limits  CBC WITH DIFFERENTIAL/PLATELET - Abnormal; Notable for the following components:   WBC 14.2 (*)    Neutro Abs 12.8 (*)    All other components within normal limits    EKG None  Radiology Dg Skull 1-3 Views  Result Date: 10/24/2017 CLINICAL DATA:  Headaches for the past 2 days. Possible shunt malfunction. EXAM: SKULL - 1-3 VIEW COMPARISON:  None in PACs FINDINGS: The ventricular shunt tube appears intact. The connecting tube at the access point also appears intact and extends into the neck to the thoracic inlet. The bony structures exhibit no acute abnormalities. IMPRESSION: The visualized portions of the ventricular shunt and its connecting tubing are intact. Electronically Signed   By: David  Martinique M.D.   On: 10/24/2017 09:18   Dg Chest 1 View  Result Date: 10/24/2017 CLINICAL DATA:  Possible ventricular shuntmalfunction EXAM: CHEST  1 VIEW COMPARISON:  Chest x-ray of March 29, 2011 FINDINGS: The ventricular shunt tube appears intact from the mid neck to the level of the superior vena cava. This is similar in position to that seen previously. The lungs are well-expanded. The interstitial markings are coarse. There is no alveolar infiltrate. The heart and pulmonary vascularity are normal. The  bony structures are unremarkable. IMPRESSION: The visualized portions of the ventricular shunt tube appear intact from the mid neck to the superior vena cava. Electronically Signed   By: David  Martinique M.D.   On: 10/24/2017 09:21   Dg Abd 1 View  Result Date: 10/24/2017 CLINICAL DATA:  Shunt malfunction. EXAM: ABDOMEN - 1 VIEW COMPARISON:  None. FINDINGS: The bowel gas pattern is normal. No radio-opaque calculi or other significant radiographic abnormality are seen. There is no evidence of ventriculoperitoneal shunt. IMPRESSION: No evidence of bowel obstruction or ileus. No definite evidence of ventriculoperitoneal shunt. Electronically Signed   By: Marijo Conception, M.D.   On: 10/24/2017 09:22   Ct Head Wo Contrast  Result Date: 10/24/2017 CLINICAL DATA:  Headache for 3 days. Nausea and vomiting. Previous placement of ventriculoperitoneal catheter shunt EXAM: CT HEAD WITHOUT CONTRAST TECHNIQUE: Contiguous axial images were obtained from the base of the skull through the vertex without intravenous contrast. COMPARISON:  None. FINDINGS: Brain: There is a shunt catheter with the tip just anterior and medial to the frontal horn of the left lateral ventricle. The ventricles are normal in size and configuration. There is no intracranial mass, hemorrhage, extra-axial fluid collection, or midline shift. Note that there is mild artifact from the shunt catheter. Given that proviso, the gray-white compartments appear unremarkable. No acute infarct is demonstrated. Vascular: No hyperdense vessel. There is slight calcification in the right carotid siphon. No other abnormal calcification evident. Skull: There is a burr hole in the right frontal bone. There is also a defect for the shunt catheter in the left parietal lobe. No acute fracture. No blastic or lytic bone lesions are evident. Sinuses/Orbits: There is mucosal thickening in several ethmoid air cells. Other visualized paranasal sinuses are clear. Orbits appear  symmetric bilaterally. Other: Visualized mastoid air cells are clear. IMPRESSION: Shunt catheter with tip just anteromedial to the frontal horn left lateral ventricle. There is no ventriculomegaly. The ventricles are normal in size and configuration. Gray-white compartments appear unremarkable.  No mass or hemorrhage. Postoperative bony changes. Mild mucosal thickening in several ethmoid air cells. Minimal calcification in right carotid siphon. Electronically Signed   By: Lowella Grip III M.D.   On: 10/24/2017 09:36    Procedures Procedures (including critical care time)  Medications Ordered in ED Medications  morphine 4 MG/ML injection 4 mg (4 mg Intravenous Given 10/24/17 1257)  valproate (DEPACON) 500 mg in dextrose 5 % 50 mL IVPB (500 mg Intravenous New Bag/Given 10/24/17 1353)  ondansetron (ZOFRAN) injection 4 mg (  has no administration in time range)  HYDROmorphone (DILAUDID) injection 1 mg (has no administration in time range)  ondansetron (ZOFRAN-ODT) disintegrating tablet 4 mg (4 mg Oral Given 10/24/17 1054)  ondansetron (ZOFRAN) injection 4 mg (4 mg Intravenous Given 10/24/17 1257)  metoCLOPramide (REGLAN) injection 10 mg (10 mg Intravenous Given 10/24/17 1344)  diphenhydrAMINE (BENADRYL) injection 25 mg (25 mg Intravenous Given 10/24/17 1344)     Initial Impression / Assessment and Plan / ED Course  I have reviewed the triage vital signs and the nursing notes.  Pertinent labs & imaging results that were available during my care of the patient were reviewed by me and considered in my medical decision making (see chart for details).    With previous shunt malfunction in 1988 patient describes that his ventricles were "normal" in size.  His symptoms persisted and required revision.  He states that later he was told his ventricle should "be very small" if the shunt was functioning normally.  His CT and shunt series today show no gross abnormalities and they show "normal sized  ventricles".  I was able to obtain results of the 2004 CT scan that showed "normal size ventricles".  I will discuss patient with neurosurgeon on-call for Dr. Sherwood Gambler for their recommendations regarding symptomatic treatment versus ongoing need for additional testing or treatments.  Final Clinical Impressions(s) / ED Diagnoses   Final diagnoses:  Acute nonintractable headache, unspecified headache type      ED Discharge Orders        Ordered    ondansetron (ZOFRAN ODT) 4 MG disintegrating tablet  Every 8 hours PRN     10/24/17 1437    HYDROcodone-acetaminophen (NORCO/VICODIN) 5-325 MG tablet  Every 4 hours PRN     10/24/17 1437       Tanna Furry, MD 10/24/17 1440

## 2017-10-24 NOTE — ED Notes (Signed)
While obtaining vitals on pt in waiting room, pt's pulse dropped down to 48 for at least one minute, then picked back up to 52. Nurse first RN, notified.

## 2017-10-24 NOTE — ED Notes (Signed)
Patient verbalizes understanding of discharge instructions. Opportunity for questioning and answers were provided. Armband removed by staff, pt discharged from ED via wheelchair with family.  

## 2017-10-24 NOTE — Discharge Instructions (Addendum)
If your symptoms return tomorrow, or if you develop worsening nausea or feer return here. [place] for pain and zofran for nausea as needed.

## 2017-10-26 DIAGNOSIS — R110 Nausea: Secondary | ICD-10-CM | POA: Insufficient documentation

## 2017-10-26 DIAGNOSIS — G43009 Migraine without aura, not intractable, without status migrainosus: Secondary | ICD-10-CM | POA: Insufficient documentation

## 2019-05-18 ENCOUNTER — Ambulatory Visit: Payer: Managed Care, Other (non HMO) | Admitting: Family Medicine

## 2019-05-18 ENCOUNTER — Other Ambulatory Visit: Payer: Self-pay

## 2019-05-18 ENCOUNTER — Ambulatory Visit: Payer: Managed Care, Other (non HMO) | Admitting: Registered Nurse

## 2019-05-18 ENCOUNTER — Encounter: Payer: Self-pay | Admitting: Family Medicine

## 2019-05-18 VITALS — BP 131/80 | HR 65 | Temp 98.6°F | Wt 204.8 lb

## 2019-05-18 DIAGNOSIS — Z13220 Encounter for screening for lipoid disorders: Secondary | ICD-10-CM | POA: Diagnosis not present

## 2019-05-18 DIAGNOSIS — Z131 Encounter for screening for diabetes mellitus: Secondary | ICD-10-CM

## 2019-05-18 DIAGNOSIS — Z1211 Encounter for screening for malignant neoplasm of colon: Secondary | ICD-10-CM

## 2019-05-18 DIAGNOSIS — F4329 Adjustment disorder with other symptoms: Secondary | ICD-10-CM

## 2019-05-18 DIAGNOSIS — D72829 Elevated white blood cell count, unspecified: Secondary | ICD-10-CM

## 2019-05-18 DIAGNOSIS — Z23 Encounter for immunization: Secondary | ICD-10-CM

## 2019-05-18 NOTE — Progress Notes (Signed)
Subjective:  Patient ID: Carlos Costa, male    DOB: [birth year]  Age: 48 y.o. MRN: [id number]  CC:  Chief Complaint  Patient presents with  . Establish Care    Est care with Dr Carlota Raspberry. Wife past 03-25-23. Hard to focus. feel scattered brained. No sure if there is anything that can be done but this all happened after passing of his wife    HPI Carlos Costa presents for   New patient to establish care, last saw him in 2015 for acute issue.  Focus difficulty: Wife Carlos Costa passed away 2023-03-25.  Feels distracted, hard to focus at times since her passing only. Not affecting work. Sudden passing after trouble breathing for a few days. Had pneumonia. Went to urgent care - sent to hospital. Stopped breathing next day. Unable to resuscitate.Friend runs a grief sharing program at church. Signed up for that course starting in January. Has had friends call and check on him few time per week (friends with Grief share program). Has support structure. Initial time off work - now back at work.  Sleeping better now, benadryl occasionally only.  Has EAP at work if needed - does not feel need at this time. Less intense grief symptoms with time.  Denies depression - just grief.   Some changes in work as well.   Migraine HA: History of hydrocephalus with VP shunt.  CT head April 2019 -shunt catheter with tip in frontal horn left lateral ventricle, no ventriculomegaly.  Ventricles were normal in size and configuration at that time.  Unremarkable gray-white compartments and no mass or hemorrhage. He has been evaluated by [facility] in May 2019 for migraine.  Sumatriptan prescribed.  Helped when had migraine prior.   Some headaches after wife passed, not recently. Has sumatriptan if needed.  Leukocytosis: Elevated at April 2019 ER visit, but did have slight elevation in 2015, WBC 12.9 at that time. No recent fever/illness. No weight loss, night sweats, fever.   Lab Results   Component Value Date   WBC 14.2 (H) 10/24/2017   HGB 16.0 10/24/2017   HCT 46.0 10/24/2017   MCV 85.7 10/24/2017   PLT 247 10/24/2017    Health maintenance: Due for colonoscopy: agrees to referral.  Defers prostate testing after R/b discussion.   flu vaccine given today.  Last ate 2 hrs ago.   History Patient Active Problem List   Diagnosis Date Noted  . Localized maculopapular rash 01/29/2014   Past Medical History:  Diagnosis Date  . GERD (gastroesophageal reflux disease)   . Hepatitis C   . Hydrocephalus [facility])    Past Surgical History:  Procedure Laterality Date  . hydrocephalus     No Known Allergies Prior to Admission medications   Medication Sig Start Date End Date Taking? Authorizing Provider  Cholecalciferol (VITAMIN D PO) Take 1,000 Units by mouth daily.     [provider]  Multiple Vitamins-Minerals (MULTIVITAMIN WITH MINERALS) tablet Take 1 tablet by mouth daily.    [provider]   Social History   Socioeconomic History  . Marital status: Single    Spouse name: Not on file  . Number of children: Not on file  . Years of education: Not on file  . Highest education level: Not on file  Occupational History  . Not on file  Social Needs  . Financial resource strain: Not on file  . Food insecurity    Worry: Not on file  Inability: Not on file  . Transportation needs    Medical: Not on file    Non-medical: Not on file  Tobacco Use  . Smoking status: Never Smoker  . Smokeless tobacco: Former Network engineer and Sexual Activity  . Alcohol use: Yes  . Drug use: No  . Sexual activity: Yes  Lifestyle  . Physical activity    Days per week: Not on file    Minutes per session: Not on file  . Stress: Not on file  Relationships  . Social Herbalist on phone: Not on file    Gets together: Not on file    Attends religious service: Not on file    Active member of club or organization: Not on file    Attends meetings of  clubs or organizations: Not on file    Relationship status: Not on file  . Intimate partner violence    Fear of current or ex partner: Not on file    Emotionally abused: Not on file    Physically abused: Not on file    Forced sexual activity: Not on file  Other Topics Concern  . Not on file  Social History Narrative  . Not on file    Review of Systems  Per HPI.   Objective:   Vitals:   05/18/19 1052  BP: 131/80  Pulse: 65  Temp: 98.6 F (37 C)  TempSrc: Oral  SpO2: 98%  Weight: 204 lb 12.8 oz (92.9 kg)     Physical Exam Vitals signs reviewed.  Constitutional:      Appearance: He is well-developed.  HENT:     Head: Normocephalic and atraumatic.  Eyes:     Pupils: Pupils are equal, round, and reactive to light.  Neck:     Vascular: No carotid bruit or JVD.  Cardiovascular:     Rate and Rhythm: Normal rate and regular rhythm.     Heart sounds: Normal heart sounds. No murmur.  Pulmonary:     Effort: Pulmonary effort is normal.     Breath sounds: Normal breath sounds. No rales.  Skin:    General: Skin is warm and dry.  Neurological:     Mental Status: He is alert and oriented to person, place, and time.  Psychiatric:        Mood and Affect: Mood normal.        Behavior: Behavior normal.        Thought Content: Thought content normal.        Judgment: Judgment normal.     Assessment & Plan:  Carlos Costa is a 48 y.o. male . Adjustment disorder with other symptom  -Suspected component of grief/adjustment disorder with difficulty with focus.  Has resources available, handout given.  Consider EAP program if worsening symptoms.  Recheck in 6 weeks.  Need for prophylactic vaccination and inoculation against influenza - Plan: Flu Vaccine QUAD 6+ mos PF IM (Fluarix Quad PF)  Screening for diabetes mellitus - Plan: Comprehensive metabolic panel  Screening for hyperlipidemia - Plan: Lipid Panel  Leukocytosis, unspecified type - Plan: CBC  -Asymptomatic.   Screen for resolution with CBC as above.  Consider hematology eval if persistent or initial path review.  Screen for colon cancer - Plan: Ambulatory referral to Gastroenterology   No orders of the defined types were placed in this encounter.  Patient Instructions       If you have lab work done today you will be contacted with your lab  results within the next 2 weeks.  If you have not heard from [place] then please contact us. The fastest way to get your results is to register for My Chart.   IF you received an x-ray today, you will receive an invoice from [facility]. Please contact [facility] at [phone] with questions or concerns regarding your invoice.   IF you received labwork today, you will receive an invoice from [facility]. Please contact [facility] at [phone] with questions or concerns regarding your invoice.   Our billing staff will not be able to assist you with questions regarding bills from these companies.  You will be contacted with the lab results as soon as they are available. The fastest way to get your results is to activate your My Chart account. Instructions are located on the last page of this paperwork. If you have not heard from [place] regarding the results in 2 weeks, please contact this office.          Signed, Merri Ray, MD [facility]

## 2019-05-18 NOTE — Patient Instructions (Addendum)
EAP program is a great option if needed for counseling. See info on adjustment disorder below, which is likely cause of focus.  Grief counseling in January may also be helpful.  Follow up if worsening symptoms.     Adjustment Disorder, Adult Adjustment disorder is a group of symptoms that can develop after a stressful life event, such as the loss of a job or serious physical illness. The symptoms can affect how you feel, think, and act. They may interfere with your relationships. Adjustment disorder increases your risk of suicide and substance abuse. If this disorder is not managed early, it can develop into a more serious condition, such as major depressive disorder or post-traumatic stress disorder. What are the causes? This condition happens when you have trouble recovering from or coping with a stressful life event. What increases the risk? You are more likely to develop this condition if:  You have had depression or anxiety.  You are being treated for a long-term (chronic) illness.  You are being treated for an illness that cannot be cured (terminal illness).  You have a family history of mental illness. What are the signs or symptoms? Symptoms of this condition include:  Extreme trouble doing daily tasks, such as going to work.  Sadness, depression, or crying spells.  Worrying a lot.  Loss of enjoyment.  Change in appetite or weight.  Feelings of loss or hopelessness.  Thoughts of suicide.  Anxiety, worry, or nervousness.  Trouble sleeping.  Avoiding family and friends.  Fighting or vandalism.  Complaining of feeling sick without being ill.  Feeling dazed or disconnected.  Nightmares.  Trouble sleeping.  Irritability.  Reckless driving.  Poor work Systems analyst.  Ignoring bills. Symptoms of this condition start within three months of the stressful event. They do not last more than six months, unless the stressful circumstances last longer. Normal  grieving after the death of a loved one is not a symptom of this condition. How is this diagnosed? To diagnose this condition, your health care provider will ask about what has happened in your life and how it has affected you. He or she may also ask about your medical history and your use of medicines, alcohol, and other substances. Your health care provider may do a physical exam and order lab tests or other studies. You may be referred to a mental health specialist. How is this treated? Treatment options for this condition include:  Counseling or talk therapy. Talk therapy is usually provided by mental health specialists.  Medicines. Certain medicines may help with depression, anxiety, and sleep.  Support groups. These offer emotional support, advice, and guidance. They are made up of people who have had similar experiences.  Observation and time. This is sometimes called "watchful waiting." In this treatment, health care providers monitor your health and behavior without other treatment. Adjustment disorder sometimes gets better on its own with time. Follow these instructions at home:  Take over-the-counter and prescription medicines only as told by your health care provider.  Keep all follow-up visits as told by your health care provider. This is important. Contact a health care provider if:  Your symptoms do not improve in six months.  Your symptoms get worse. Get help right away if:  You have serious thoughts about hurting yourself or someone else. If you ever feel like you may hurt yourself or others, or have thoughts about taking your own life, get help right away. You can go to your nearest emergency department or call:  Your local emergency services (911 in the [place]  A suicide crisis helpline, such as the [place] at 587-106-6191. This is open 24 hours a day. Summary  Adjustment disorder is a group of symptoms that can develop after a  stressful life event, such as the loss of a job or serious physical illness. The symptoms can affect how you feel, think, and act. They may interfere with your relationships.  Symptoms of this condition start within three months of the stressful event. They do not last more than six months, unless the stressful circumstances last longer.  Treatment may include talk therapy, medicines, participation in a support group, or observation to see if symptoms improve.  Contact your health care provider if your symptoms get worse or do not improve in six months.  If you ever feel like you may hurt yourself or others, or have thoughts about taking your own life, get help right away. This information is not intended to replace advice given to you by your health care provider. Make sure you discuss any questions you have with your health care provider. Document Released: 02/16/2006 Document Revised: 05/27/2017 Document Reviewed: 08/13/2016 Elsevier Patient Education  [employer].   If you have lab work done today you will be contacted with your lab results within the next 2 weeks.  If you have not heard from [place] then please contact us. The fastest way to get your results is to register for My Chart.   IF you received an x-ray today, you will receive an invoice from [facility]. Please contact [facility] at [phone] with questions or concerns regarding your invoice.   IF you received labwork today, you will receive an invoice from [facility]. Please contact [facility] at [phone] with questions or concerns regarding your invoice.   Our billing staff will not be able to assist you with questions regarding bills from these companies.  You will be contacted with the lab results as soon as they are available. The fastest way to get your results is to activate your My Chart account. Instructions are located on the last page of this paperwork. If you have not heard from [place] regarding  the results in 2 weeks, please contact this office.

## 2019-05-22 ENCOUNTER — Encounter: Payer: Self-pay | Admitting: Gastroenterology

## 2019-06-04 ENCOUNTER — Ambulatory Visit (AMBULATORY_SURGERY_CENTER): Payer: Managed Care, Other (non HMO)

## 2019-06-04 ENCOUNTER — Other Ambulatory Visit: Payer: Self-pay

## 2019-06-04 ENCOUNTER — Encounter: Payer: Self-pay | Admitting: Gastroenterology

## 2019-06-04 VITALS — Temp 96.2°F | Ht 66.0 in | Wt 202.6 lb

## 2019-06-04 DIAGNOSIS — Z1211 Encounter for screening for malignant neoplasm of colon: Secondary | ICD-10-CM

## 2019-06-04 DIAGNOSIS — Z1159 Encounter for screening for other viral diseases: Secondary | ICD-10-CM

## 2019-06-04 MED ORDER — NA SULFATE-K SULFATE-MG SULF 17.5-3.13-1.6 GM/177ML PO SOLN: 1.0000 | Freq: Once | ORAL | 0 refills | Status: AC

## 2019-06-04 NOTE — Progress Notes (Signed)

## 2019-06-06 ENCOUNTER — Ambulatory Visit (INDEPENDENT_AMBULATORY_CARE_PROVIDER_SITE_OTHER): Payer: Managed Care, Other (non HMO)

## 2019-06-06 DIAGNOSIS — Z1159 Encounter for screening for other viral diseases: Secondary | ICD-10-CM

## 2019-06-07 LAB — SARS CORONAVIRUS 2 (TAT 6-24 HRS): SARS Coronavirus 2: NEGATIVE

## 2019-06-11 ENCOUNTER — Other Ambulatory Visit: Payer: Self-pay

## 2019-06-11 ENCOUNTER — Encounter: Payer: Self-pay | Admitting: Gastroenterology

## 2019-06-11 ENCOUNTER — Ambulatory Visit (AMBULATORY_SURGERY_CENTER): Payer: Managed Care, Other (non HMO) | Admitting: Gastroenterology

## 2019-06-11 VITALS — BP 116/95 | HR 90 | Temp 98.2°F | Resp 11 | Ht 66.0 in | Wt 202.0 lb

## 2019-06-11 DIAGNOSIS — Z1211 Encounter for screening for malignant neoplasm of colon: Secondary | ICD-10-CM

## 2019-06-11 DIAGNOSIS — D128 Benign neoplasm of rectum: Secondary | ICD-10-CM

## 2019-06-11 DIAGNOSIS — D129 Benign neoplasm of anus and anal canal: Secondary | ICD-10-CM

## 2019-06-11 HISTORY — PX: COLONOSCOPY: SHX174

## 2019-06-11 MED ORDER — SODIUM CHLORIDE 0.9 % IV SOLN: 500.0000 mL | Freq: Once | INTRAVENOUS | Status: DC

## 2019-06-11 NOTE — Progress Notes (Signed)
Temp check by:[facility] Vital check by:[place]  The patient states no changes in medical or surgical history since pre-visit screening on 06/04/2019.

## 2019-06-11 NOTE — Progress Notes (Signed)
Called to room to assist during endoscopic procedure.  Patient ID and intended procedure confirmed with present staff. Received instructions for my participation in the procedure from the performing physician.  

## 2019-06-11 NOTE — Patient Instructions (Signed)
Impression/Recommendations:  [place] handout given to patient.  Resume previous diet. Continue present medications.  Await pathology results.  Repeat colonoscopy recommended for surveillance.  Date to be determined after pathology results reviewed.  YOU HAD AN ENDOSCOPIC PROCEDURE TODAY AT [facility]:   Refer to the procedure report that was given to you for any specific questions about what was found during the examination.  If the procedure report does not answer your questions, please call your gastroenterologist to clarify.  If you requested that your care partner not be given the details of your procedure findings, then the procedure report has been included in a sealed envelope for you to review at your convenience later.  YOU SHOULD EXPECT: Some feelings of bloating in the abdomen. Passage of more gas than usual.  Walking can help get rid of the air that was put into your GI tract during the procedure and reduce the bloating. If you had a lower endoscopy (such as a colonoscopy or flexible sigmoidoscopy) you may notice spotting of blood in your stool or on the toilet paper. If you underwent a bowel prep for your procedure, you may not have a normal bowel movement for a few days.  Please Note:  You might notice some irritation and congestion in your nose or some drainage.  This is from the oxygen used during your procedure.  There is no need for concern and it should clear up in a day or so.  SYMPTOMS TO REPORT IMMEDIATELY:   Following lower endoscopy (colonoscopy or flexible sigmoidoscopy):  Excessive amounts of blood in the stool  Significant tenderness or worsening of abdominal pains  Swelling of the abdomen that is new, acute  Fever of 100F or higher For urgent or emergent issues, a gastroenterologist can be reached at any hour by calling [phone].   DIET:  We do recommend a small meal at first, but then you may proceed to your regular diet.  Drink plenty  of fluids but you should avoid alcoholic beverages for 24 hours.  ACTIVITY:  You should plan to take it easy for the rest of today and you should NOT DRIVE or use heavy machinery until tomorrow (because of the sedation medicines used during the test).    FOLLOW UP: Our staff will call the number listed on your records 48-72 hours following your procedure to check on you and address any questions or concerns that you may have regarding the information given to you following your procedure. If we do not reach you, we will leave a message.  We will attempt to reach you two times.  During this call, we will ask if you have developed any symptoms of COVID 19. If you develop any symptoms (ie: fever, flu-like symptoms, shortness of breath, cough etc.) before then, please call [phone].  If you test positive for Covid 19 in the 2 weeks post procedure, please call and report this information to [place].    If any biopsies were taken you will be contacted by phone or by letter within the next 1-3 weeks.  Please call us at [phone] if you have not heard about the biopsies in 3 weeks.    SIGNATURES/CONFIDENTIALITY: You and/or your care partner have signed paperwork which will be entered into your electronic medical record.  These signatures attest to the fact that that the information above on your After Visit Summary has been reviewed and is understood.  Full responsibility of the confidentiality of this discharge information lies  with you and/or your care-partner.

## 2019-06-11 NOTE — Op Note (Signed)
[facility] Patient Name: Carlos Costa Procedure Date: 06/11/2019 1:57 PM MRN: [id number] Endoscopist: Mallie Mussel L. Loletha Carrow , MD Age: 48 Referring MD:  Date of Birth: Jun 20, 1971 Gender: Male Account #: [id number] Procedure:                Colonoscopy Indications:              Screening for colorectal malignant neoplasm, This                            is the patient's first colonoscopy Medicines:                Monitored Anesthesia Care Procedure:                Pre-Anesthesia Assessment:                           - Prior to the procedure, a History and Physical                            was performed, and patient medications and                            allergies were reviewed. The patient's tolerance of                            previous anesthesia was also reviewed. The risks                            and benefits of the procedure and the sedation                            options and risks were discussed with the patient.                            All questions were answered, and informed consent                            was obtained. Prior Anticoagulants: The patient has                            taken no previous anticoagulant or antiplatelet                            agents. ASA Grade Assessment: II - A patient with                            mild systemic disease. After reviewing the risks                            and benefits, the patient was deemed in                            satisfactory condition to undergo the procedure.  After obtaining informed consent, the colonoscope                            was passed under direct vision. Throughout the                            procedure, the patient's blood pressure, pulse, and                            oxygen saturations were monitored continuously. The                            Colonoscope was introduced through the anus and                            advanced to the the cecum,  identified by                            appendiceal orifice and ileocecal valve. The                            colonoscopy was performed without difficulty. The                            patient tolerated the procedure well. The quality                            of the bowel preparation was excellent. The                            ileocecal valve, appendiceal orifice, and rectum                            were photographed. Scope In: 2:06:53 PM Scope Out: 2:24:18 PM Scope Withdrawal Time: 0 hours 14 minutes 18 seconds  Total Procedure Duration: 0 hours 17 minutes 25 seconds  Findings:                 The perianal and digital rectal examinations were                            normal.                           A 8 mm polyp was found in the recto-sigmoid colon.                            The polyp was pedunculated. The polyp was removed                            with a hot snare. Resection and retrieval were                            complete.  A diminutive polyp was found in the distal rectum.                            The polyp was sessile. The polyp was removed with a                            cold snare. Resection and retrieval were complete.                           The exam was otherwise without abnormality on                            direct and retroflexion views. Complications:            No immediate complications. Estimated Blood Loss:     Estimated blood loss: none. Estimated blood loss                            was minimal. Impression:               - One 8 mm polyp at the recto-sigmoid colon,                            removed with a hot snare. Resected and retrieved.                           - One diminutive polyp in the distal rectum,                            removed with a cold snare. Resected and retrieved.                           - The examination was otherwise normal on direct                            and retroflexion  views. Recommendation:           - Patient has a contact number available for                            emergencies. The signs and symptoms of potential                            delayed complications were discussed with the                            patient. Return to normal activities tomorrow.                            Written discharge instructions were provided to the                            patient.                           -  Resume previous diet.                           - Continue present medications.                           - Await pathology results.                           - Repeat colonoscopy is recommended for                            surveillance. The colonoscopy date will be                            determined after pathology results from today's                            exam become available for review. Abagael Kramm L. Loletha Carrow, MD 06/11/2019 2:28:53 PM This report has been signed electronically.

## 2019-06-11 NOTE — Progress Notes (Signed)
To PACU, VSS. Report to [email or web address] 

## 2019-06-13 ENCOUNTER — Telehealth: Payer: Self-pay

## 2019-06-13 NOTE — Telephone Encounter (Signed)
  Follow up Call-  Call back number 06/11/2019  Post procedure Call Back phone  # [phone]  Permission to leave phone message Yes  Some recent data might be hidden     Patient questions:  Do you have a fever, pain , or abdominal swelling? No. Pain Score  0 *  Have you tolerated food without any problems? Yes.    Have you been able to return to your normal activities? Yes.    Do you have any questions about your discharge instructions: Diet   No. Medications  No. Follow up visit  No.  Do you have questions or concerns about your Care? No.  Actions: * If pain score is 4 or above: 1. No action needed, pain <4.Have you developed a fever since your procedure? no  2.   Have you had an respiratory symptoms (SOB or cough) since your procedure? no  3.   Have you tested positive for COVID 19 since your procedure no  4.   Have you had any family members/close contacts diagnosed with the COVID 19 since your procedure?  no   If yes to any of these questions please route to Joylene Eulalio, RN and Alphonsa Gin, Therapist, sports.

## 2019-06-18 ENCOUNTER — Encounter: Payer: Self-pay | Admitting: Gastroenterology

## 2019-07-04 ENCOUNTER — Other Ambulatory Visit: Payer: Self-pay

## 2019-07-04 ENCOUNTER — Ambulatory Visit (INDEPENDENT_AMBULATORY_CARE_PROVIDER_SITE_OTHER): Payer: Managed Care, Other (non HMO) | Admitting: Family Medicine

## 2019-07-04 ENCOUNTER — Encounter: Payer: Self-pay | Admitting: Family Medicine

## 2019-07-04 VITALS — BP 122/81 | HR 90 | Temp 97.8°F | Wt 204.2 lb

## 2019-07-04 DIAGNOSIS — D72829 Elevated white blood cell count, unspecified: Secondary | ICD-10-CM | POA: Diagnosis not present

## 2019-07-04 DIAGNOSIS — Z0001 Encounter for general adult medical examination with abnormal findings: Secondary | ICD-10-CM

## 2019-07-04 DIAGNOSIS — Z131 Encounter for screening for diabetes mellitus: Secondary | ICD-10-CM | POA: Diagnosis not present

## 2019-07-04 DIAGNOSIS — Z23 Encounter for immunization: Secondary | ICD-10-CM

## 2019-07-04 DIAGNOSIS — Z13220 Encounter for screening for lipoid disorders: Secondary | ICD-10-CM

## 2019-07-04 DIAGNOSIS — F4329 Adjustment disorder with other symptoms: Secondary | ICD-10-CM

## 2019-07-04 DIAGNOSIS — Z6832 Body mass index (BMI) 32.0-32.9, adult: Secondary | ICD-10-CM

## 2019-07-04 DIAGNOSIS — Z0000 Encounter for general adult medical examination without abnormal findings: Secondary | ICD-10-CM

## 2019-07-04 LAB — CBC
Hematocrit: 46.2 % (ref 37.5–51.0)
Hemoglobin: 15.5 g/dL (ref 13.0–17.7)
MCH: 28.8 pg (ref 26.6–33.0)
MCHC: 33.5 g/dL (ref 31.5–35.7)
MCV: 86 fL (ref 79–97)
Platelets: 283 10*3/uL (ref 150–450)
RBC: 5.38 x10E6/uL (ref 4.14–5.80)
RDW: 13.2 % (ref 11.6–15.4)
WBC: 8.3 10*3/uL (ref 3.4–10.8)

## 2019-07-04 LAB — LIPID PANEL
Chol/HDL Ratio: 3.1 ratio (ref 0.0–5.0)
Cholesterol, Total: 228 mg/dL — ABNORMAL HIGH (ref 100–199)
HDL: 74 mg/dL (ref >39)
LDL Chol Calc (NIH): 134 mg/dL — ABNORMAL HIGH (ref 0–99)
Triglycerides: 112 mg/dL (ref 0–149)
VLDL Cholesterol Cal: 20 mg/dL (ref 5–40)

## 2019-07-04 LAB — COMPREHENSIVE METABOLIC PANEL
ALT: 24 IU/L (ref 0–44)
AST: 18 IU/L (ref 0–40)
Albumin/Globulin Ratio: 2.2 (ref 1.2–2.2)
Albumin: 4.9 g/dL (ref 4.0–5.0)
Alkaline Phosphatase: 84 IU/L (ref 39–117)
BUN/Creatinine Ratio: 20 (ref 9–20)
BUN: 21 mg/dL (ref 6–24)
Bilirubin Total: 0.4 mg/dL (ref 0.0–1.2)
CO2: 23 mmol/L (ref 20–29)
Calcium: 9.7 mg/dL (ref 8.7–10.2)
Chloride: 101 mmol/L (ref 96–106)
Creatinine, Ser: 1.07 mg/dL (ref 0.76–1.27)
GFR calc Af Amer: 93 mL/min/{1.73_m2} (ref >59)
GFR calc non Af Amer: 81 mL/min/{1.73_m2} (ref >59)
Globulin, Total: 2.2 g/dL (ref 1.5–4.5)
Glucose: 98 mg/dL (ref 65–99)
Potassium: 4.5 mmol/L (ref 3.5–5.2)
Sodium: 139 mmol/L (ref 134–144)
Total Protein: 7.1 g/dL (ref 6.0–8.5)

## 2019-07-04 MED ORDER — SHINGRIX 50 MCG/0.5ML IM SUSR: 0.5000 mL | Freq: Once | INTRAMUSCULAR | 1 refills | Status: AC

## 2019-07-04 NOTE — Patient Instructions (Addendum)
Shingles vaccine sent to pharmacy.  If white blood cell count is still elevated, we can follow-up to discuss further work-up or potential causes.  Keep follow-up with grief share group as planned, but please let me know if you are having difficulty with focus for any worsening symptoms.  If lab work looks okay, can follow-up with me in 1 year for physical.  Please let me know if there are questions in the meantime and stay safe!  Keeping you healthy  Get these tests  Blood pressure- Have your blood pressure checked once a year by your healthcare provider.  Normal blood pressure is 120/80  Weight- Have your body mass index (BMI) calculated to screen for obesity.  BMI is a measure of body fat based on height and weight. You can also calculate your own BMI at [email or web address].  Cholesterol- Have your cholesterol checked every year.  Diabetes- Have your blood sugar checked regularly if you have high blood pressure, high cholesterol, have a family history of diabetes or if you are overweight.  Screening for Colon Cancer- Colonoscopy starting at age 43.  Screening may begin sooner depending on your family history and other health conditions. Follow up colonoscopy as directed by your Gastroenterologist.  Screening for Prostate Cancer- Both blood work (PSA) and a rectal exam help screen for Prostate Cancer.  Screening begins at age 61 with African-American men and at age 73 with Caucasian men.  Screening may begin sooner depending on your family history.  Take these medicines  Aspirin- One aspirin daily can help prevent Heart disease and Stroke.  Flu shot- Every fall.  Tetanus- Every 10 years.  Zostavax- Once after the age of 77 to prevent Shingles.  Pneumonia shot- Once after the age of 22; if you are younger than 51, ask your healthcare provider if you need a Pneumonia shot.  Take these steps  Don't smoke- If you do smoke, talk to your doctor about quitting.  For tips on how to  quit, go to www.smokefree.gov or call 1-800-QUIT-NOW.  Be physically active- Exercise 5 days a week for at least 30 minutes.  If you are not already physically active start slow and gradually work up to 30 minutes of moderate physical activity.  Examples of moderate activity include walking briskly, mowing the yard, dancing, swimming, bicycling, etc.  Eat a healthy diet- Eat a variety of healthy food such as fruits, vegetables, low fat milk, low fat cheese, yogurt, lean meant, poultry, fish, beans, tofu, etc. For more information go to www.thenutritionsource.org  Drink alcohol in moderation- Limit alcohol intake to less than two drinks a day. Never drink and drive.  Dentist- Brush and floss twice daily; visit your dentist twice a year.  Depression- Your emotional health is as important as your physical health. If you're feeling down, or losing interest in things you would normally enjoy please talk to your healthcare provider.  Eye exam- Visit your eye doctor every year.  Safe sex- If you may be exposed to a sexually transmitted infection, use a condom.  Seat belts- Seat belts can save your life; always wear one.  Smoke/Carbon Monoxide detectors- These detectors need to be installed on the appropriate level of your home.  Replace batteries at least once a year.  Skin cancer- When out in the sun, cover up and use sunscreen 15 SPF or higher.  Violence- If anyone is threatening you, please tell your healthcare provider.  Living Will/ Health care power of attorney- Speak with your healthcare provider and  family.   If you have lab work done today you will be contacted with your lab results within the next 2 weeks.  If you have not heard from [place] then please contact us. The fastest way to get your results is to register for My Chart.   IF you received an x-ray today, you will receive an invoice from [facility]. Please contact [facility] at [phone] with questions or  concerns regarding your invoice.   IF you received labwork today, you will receive an invoice from [facility]. Please contact [facility] at [phone] with questions or concerns regarding your invoice.   Our billing staff will not be able to assist you with questions regarding bills from these companies.  You will be contacted with the lab results as soon as they are available. The fastest way to get your results is to activate your My Chart account. Instructions are located on the last page of this paperwork. If you have not heard from [place] regarding the results in 2 weeks, please contact this office.

## 2019-07-04 NOTE — Progress Notes (Signed)
Subjective:  Patient ID: Carlos Costa, male    DOB: 1971-06-09  Age: 49 y.o. MRN: [id number]  CC:  Chief Complaint  Patient presents with  . Annual Exam    Patient is not having any other issues at this time and stated his focusing/attention is better no need for any intervention at this time    HPI Carlos Costa presents for  Annual exam.  Adjustment disorder: Last visit in November, some adjustment disorder at that time with focus/attention issues Grief share group starts next Wednesday.  Doing better with focus.    Migraine HA: history of migraine headaches, obstructive hydrocephalus,followed by [place] neurology. Doing well. Thought to be stress component.  New job starts on 1/25 - less stressful.  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    History of leukocytosis with planned repeat lab work Asymptomatic including no fever, illness, weight loss, night sweats, or fevers when discussed at May 18, 2019 visit.  Has not had repeat blood work yet. No new fever/illness. Feels ok.   Cancer screening: Colonoscopy 06/11/19 - did well. Dr. Loletha Carrow. 2 polyps, repeat in 5 years.  Prostate: PSA 0.80 in 10/2012. Agrees to blood test only after R/B discussion.    Immunization History  Administered Date(s) Administered  . Influenza,inj,Quad PF,6+ Mos 05/18/2019  tetanus about 5 years ago - with the city.  Agrees to shingles vaccine.   Depression screen [facility] 2/9 07/04/2019 05/18/2019  Decreased Interest 0 1  Down, Depressed, Hopeless 0 0  PHQ - 2 Score 0 1    Hearing Screening   125Hz  250Hz  500Hz  1000Hz  2000Hz  3000Hz  4000Hz  6000Hz  8000Hz   Right ear:           Left ear:             Visual Acuity Screening   Right eye Left eye Both eyes  Without correction:     With correction: 20/20 20/25 20/20   last optho eval April 2020.   Dental: every 3 months (peridontal dz, closer monitoring).   Exercise: at work only.  Body mass index is  32.96 kg/m. New job should be better for diet - less fast food.   History Patient Active Problem List   Diagnosis Date Noted  . Migraine without aura and without status migrainosus, not intractable 10/26/2017  . Nausea 10/26/2017  . Localized maculopapular rash 01/29/2014   Past Medical History:  Diagnosis Date  . GERD (gastroesophageal reflux disease)   . Hepatitis C   . Hydrocephalus ([place])   . Migraine    Past Surgical History:  Procedure Laterality Date  . BRAIN SURGERY  1988  . COLONOSCOPY  06/11/2019  . hydrocephalus     No Known Allergies Prior to Admission medications   Medication Sig Start Date End Date Taking? Authorizing Provider  Cholecalciferol (VITAMIN D3) 100000 UNIT/GM POWD Take by mouth.   Yes [provider]  eletriptan (RELPAX) 40 MG tablet Take by mouth. 05/28/19 05/27/20 Yes [provider]  glucosamine-chondroitin 500-400 MG tablet Take by mouth.   Yes [provider]  Multiple Vitamins-Minerals (MULTIVITAMIN WITH MINERALS) tablet Take 1 tablet by mouth daily.   Yes [provider]   Social History   Socioeconomic History  . Marital status: Widowed    Spouse name: Not on file  . Number of children: Not on file  . Years of education: Not on file  . Highest education level: Not on file  Occupational History  .  Not on file  Tobacco Use  . Smoking status: Former Smoker    Years: 23.00    Quit date: 2013    Years since quitting: 8.0  . Smokeless tobacco: Former Systems developer    Quit date: 1989  Substance and Sexual Activity  . Alcohol use: Yes    Alcohol/week: 1.0 standard drinks    Types: 1 Cans of beer per week  . Drug use: No  . Sexual activity: Yes  Other Topics Concern  . Not on file  Social History Narrative  . Not on file   Social Determinants of Health   Financial Resource Strain:   . Difficulty of Paying Living Expenses: Not on file  Food Insecurity:   . Worried About Charity fundraiser in the Last  Year: Not on file  . Ran Out of Food in the Last Year: Not on file  Transportation Needs:   . Lack of Transportation (Medical): Not on file  . Lack of Transportation (Non-Medical): Not on file  Physical Activity:   . Days of Exercise per Week: Not on file  . Minutes of Exercise per Session: Not on file  Stress:   . Feeling of Stress : Not on file  Social Connections:   . Frequency of Communication with Friends and Family: Not on file  . Frequency of Social Gatherings with Friends and Family: Not on file  . Attends Religious Services: Not on file  . Active Member of Clubs or Organizations: Not on file  . Attends Archivist Meetings: Not on file  . Marital Status: Not on file  Intimate Partner Violence:   . Fear of Current or Ex-Partner: Not on file  . Emotionally Abused: Not on file  . Physically Abused: Not on file  . Sexually Abused: Not on file    Review of Systems 13 point review of systems per patient health survey noted.  Negative other than as indicated above or in HPI.    Objective:   Vitals:   07/04/19 1009  BP: 122/81  Pulse: 90  Temp: 97.8 F (36.6 C)  TempSrc: Temporal  SpO2: 98%  Weight: 204 lb 3.2 oz (92.6 kg)     Physical Exam Vitals reviewed.  Constitutional:      Appearance: He is well-developed.  HENT:     Head: Normocephalic and atraumatic.     Right Ear: External ear normal.     Left Ear: External ear normal.  Eyes:     Conjunctiva/sclera: Conjunctivae normal.     Pupils: Pupils are equal, round, and reactive to light.  Neck:     Thyroid: No thyromegaly.  Cardiovascular:     Rate and Rhythm: Normal rate and regular rhythm.     Heart sounds: Normal heart sounds.  Pulmonary:     Effort: Pulmonary effort is normal. No respiratory distress.     Breath sounds: Normal breath sounds. No wheezing.  Abdominal:     General: There is no distension.     Palpations: Abdomen is soft.     Tenderness: There is no abdominal tenderness.    Musculoskeletal:        General: No tenderness. Normal range of motion.     Cervical back: Normal range of motion and neck supple.  Lymphadenopathy:     Cervical: No cervical adenopathy.  Skin:    General: Skin is warm and dry.  Neurological:     Mental Status: He is alert and oriented to person, place, and time.  Deep Tendon Reflexes: Reflexes are normal and symmetric.  Psychiatric:        Behavior: Behavior normal.     Assessment & Plan:  Carlos Costa is a 49 y.o. male . Annual physical exam  - -anticipatory guidance as below in AVS, screening labs above. Health maintenance items as above in HPI discussed/recommended as applicable.   -Continue follow-up with headache specialist.  Stable currently.  Leukocytosis, unspecified type - Plan: CBC  -Asymptomatic, repeat CBC.  Screening for diabetes mellitus - Plan: Comprehensive metabolic panel  Screening for hyperlipidemia - Plan: Lipid Panel  Need for shingles vaccine - Plan: Zoster Vaccine Adjuvanted [facility]) injection  Adjustment disorder with other symptom  -Improving, has planned visit with grief share group.  RTC precautions if increasing symptoms with focus or new symptoms.  BMI 32.0-32.9,adult  -Activity/exercise, dietary changes discussed with goal of BMI less than 30  Meds ordered this encounter  Medications  . Zoster Vaccine Adjuvanted [facility]) injection    Sig: Inject 0.5 mLs into the muscle once for 1 dose. Repeat in 2-6 months.    Dispense:  0.5 mL    Refill:  1   Patient Instructions    Shingles vaccine sent to pharmacy.  If white blood cell count is still elevated, we can follow-up to discuss further work-up or potential causes.  Keep follow-up with grief share group as planned, but please let me know if you are having difficulty with focus for any worsening symptoms.  If lab work looks okay, can follow-up with me in 1 year for physical.  Please let me know if there are questions in the meantime  and stay safe!  Keeping you healthy  Get these tests  Blood pressure- Have your blood pressure checked once a year by your healthcare provider.  Normal blood pressure is 120/80  Weight- Have your body mass index (BMI) calculated to screen for obesity.  BMI is a measure of body fat based on height and weight. You can also calculate your own BMI at [email or web address].  Cholesterol- Have your cholesterol checked every year.  Diabetes- Have your blood sugar checked regularly if you have high blood pressure, high cholesterol, have a family history of diabetes or if you are overweight.  Screening for Colon Cancer- Colonoscopy starting at age 31.  Screening may begin sooner depending on your family history and other health conditions. Follow up colonoscopy as directed by your Gastroenterologist.  Screening for Prostate Cancer- Both blood work (PSA) and a rectal exam help screen for Prostate Cancer.  Screening begins at age 79 with African-American men and at age 8 with Caucasian men.  Screening may begin sooner depending on your family history.  Take these medicines  Aspirin- One aspirin daily can help prevent Heart disease and Stroke.  Flu shot- Every fall.  Tetanus- Every 10 years.  Zostavax- Once after the age of 20 to prevent Shingles.  Pneumonia shot- Once after the age of 24; if you are younger than 37, ask your healthcare provider if you need a Pneumonia shot.  Take these steps  Don't smoke- If you do smoke, talk to your doctor about quitting.  For tips on how to quit, go to www.smokefree.gov or call 1-800-QUIT-NOW.  Be physically active- Exercise 5 days a week for at least 30 minutes.  If you are not already physically active start slow and gradually work up to 30 minutes of moderate physical activity.  Examples of moderate activity include walking briskly, mowing the yard, dancing, swimming,  bicycling, etc.  Eat a healthy diet- Eat a variety of healthy food such as  fruits, vegetables, low fat milk, low fat cheese, yogurt, lean meant, poultry, fish, beans, tofu, etc. For more information go to www.thenutritionsource.org  Drink alcohol in moderation- Limit alcohol intake to less than two drinks a day. Never drink and drive.  Dentist- Brush and floss twice daily; visit your dentist twice a year.  Depression- Your emotional health is as important as your physical health. If you're feeling down, or losing interest in things you would normally enjoy please talk to your healthcare provider.  Eye exam- Visit your eye doctor every year.  Safe sex- If you may be exposed to a sexually transmitted infection, use a condom.  Seat belts- Seat belts can save your life; always wear one.  Smoke/Carbon Monoxide detectors- These detectors need to be installed on the appropriate level of your home.  Replace batteries at least once a year.  Skin cancer- When out in the sun, cover up and use sunscreen 15 SPF or higher.  Violence- If anyone is threatening you, please tell your healthcare provider.  Living Will/ Health care power of attorney- Speak with your healthcare provider and family.   If you have lab work done today you will be contacted with your lab results within the next 2 weeks.  If you have not heard from [place] then please contact us. The fastest way to get your results is to register for My Chart.   IF you received an x-ray today, you will receive an invoice from [facility]. Please contact [facility] at [phone] with questions or concerns regarding your invoice.   IF you received labwork today, you will receive an invoice from [facility]. Please contact [facility] at [phone] with questions or concerns regarding your invoice.   Our billing staff will not be able to assist you with questions regarding bills from these companies.  You will be contacted with the lab results as soon as they are available. The fastest way to get your  results is to activate your My Chart account. Instructions are located on the last page of this paperwork. If you have not heard from [place] regarding the results in 2 weeks, please contact this office.          Signed, Merri Ray, MD [facility]

## 2019-07-26 IMAGING — CT CT HEAD W/O CM
3 of 4 series · 14 of 47 positions shown, 16 images · non-contrast
Comparison: None.

CLINICAL DATA: Headache for 3 days. Nausea and vomiting. Previous
placement of ventriculoperitoneal catheter shunt

EXAM:
CT HEAD WITHOUT CONTRAST
TECHNIQUE: Contiguous axial images were obtained from the base of the skull
through the vertex without intravenous contrast.

[Series 4: head 2.0 h70h · axial · 0.43mm/px · z∈[-90,+38]mm · 8 of 82 slices shown, 10 images]
[im 9/82  brain]
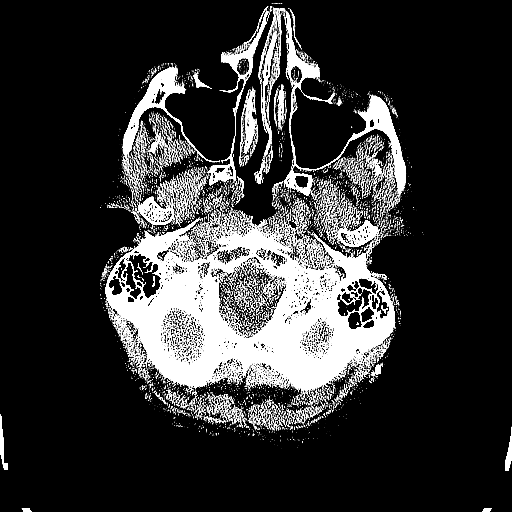
[im 9/82  bone]
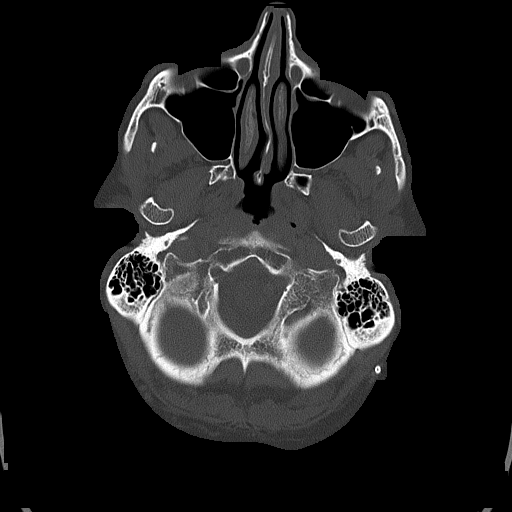
[im 17/82  brain]
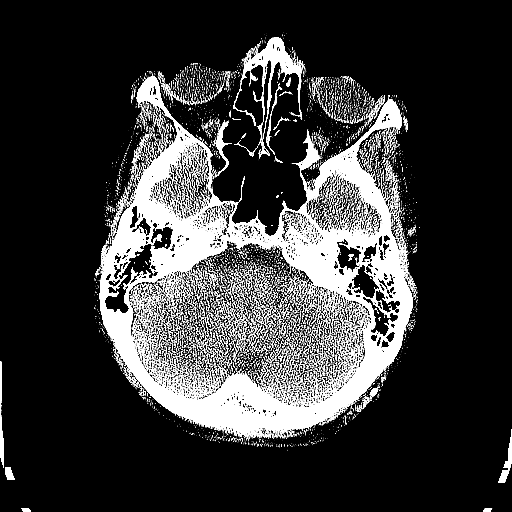
[im 25/82  brain]
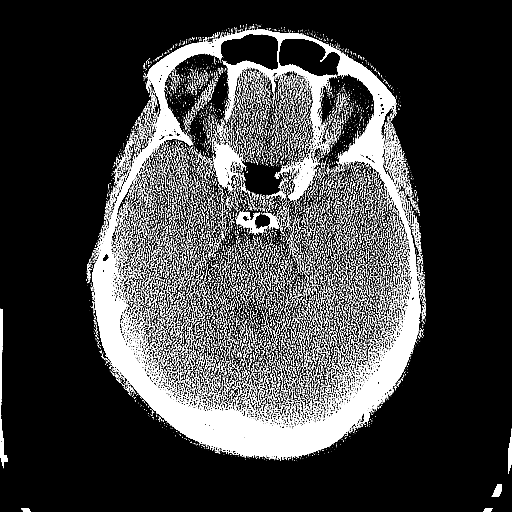
[im 37/82  brain]
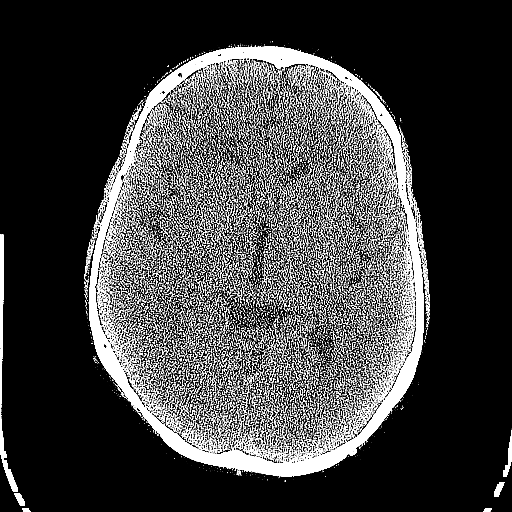
[im 45/82  brain]
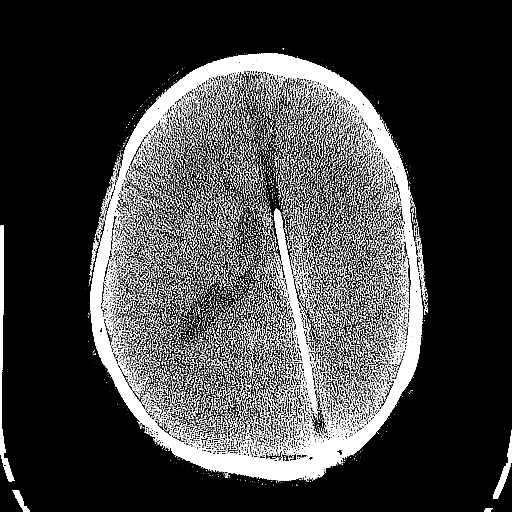
[im 45/82  bone]
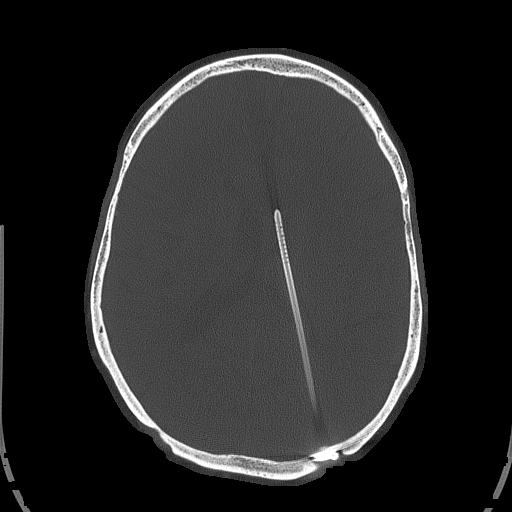
[im 57/82  brain]
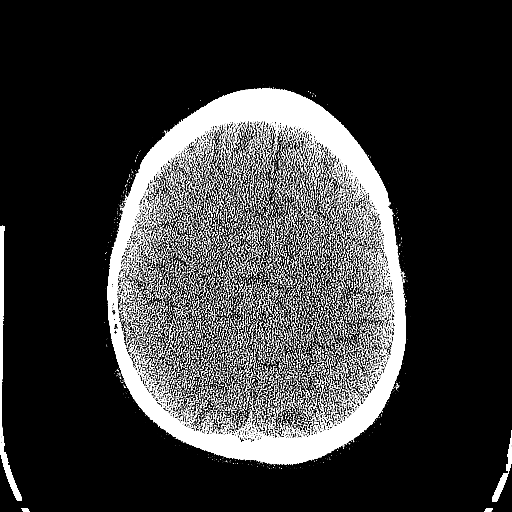
[im 65/82  brain]
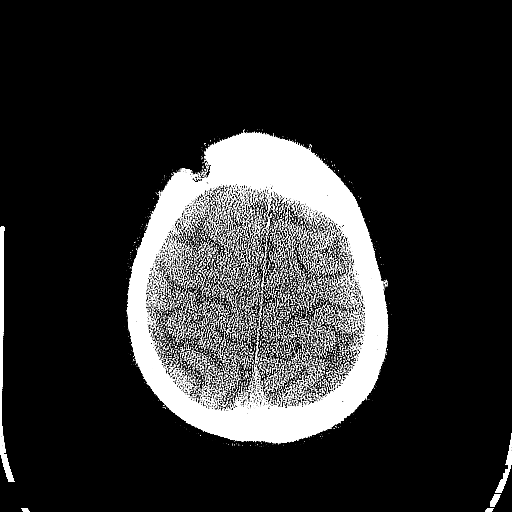
[im 73/82  brain]
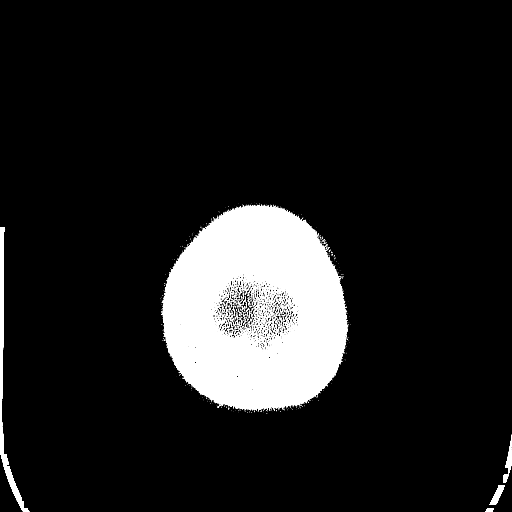

[Series 5: head 3.0 mpr cor · coronal · 0.32mm/px · 3 of 69 slices shown]
[im 23/69  brain]
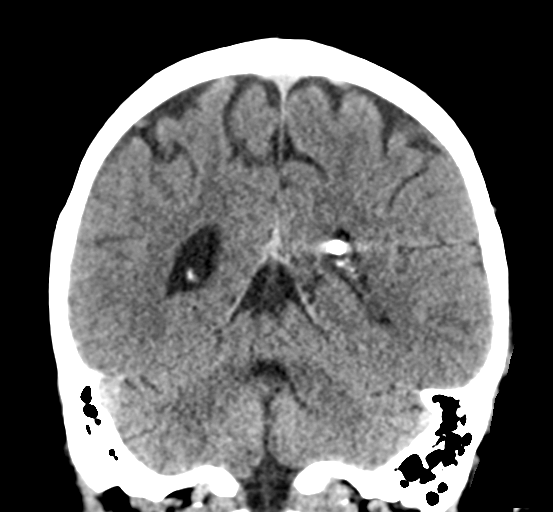
[im 31/69  brain]
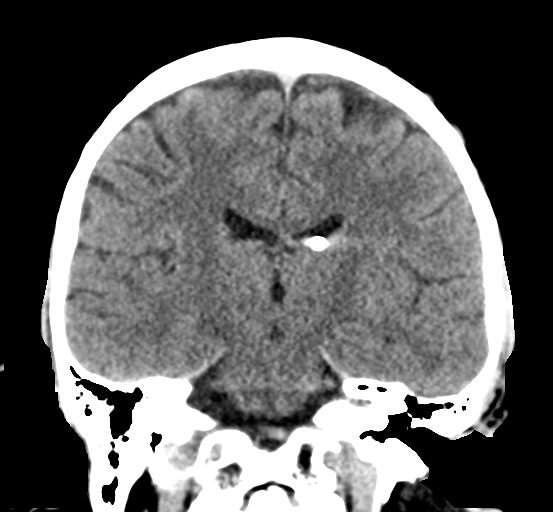
[im 38/69  brain]
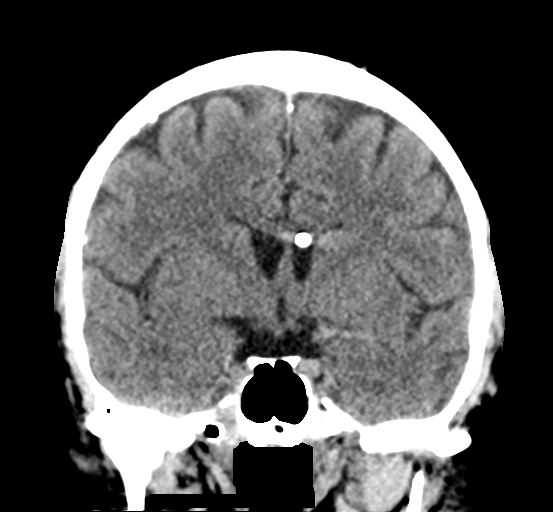

[Series 6: head 3.0 mpr sag · sagittal · 0.32mm/px · 3 of 59 slices shown]
[im 20/59  brain]
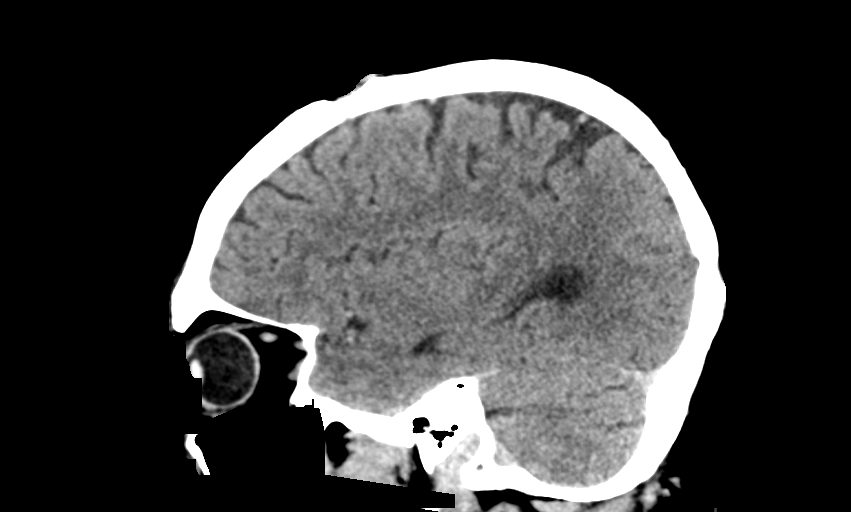
[im 30/59  brain]
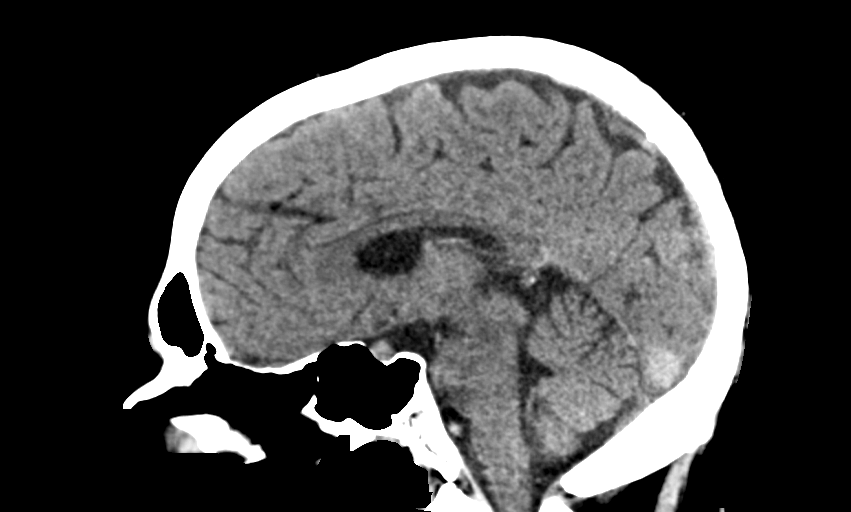
[im 39/59  brain]
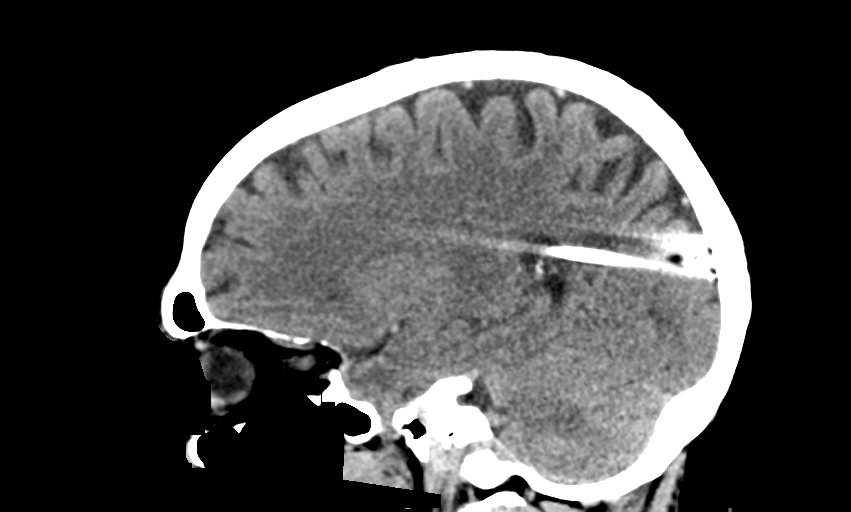

[14 of 47 positions shown; findings below may reference images not displayed]

FINDINGS: Brain: There is a shunt catheter with the tip just anterior and
medial to the frontal horn of the left lateral ventricle. The
ventricles are normal in size and configuration. There is no
intracranial mass, hemorrhage, extra-axial fluid collection, or
midline shift. Note that there is mild artifact from the shunt
catheter. Given that proviso, the gray-white compartments appear
unremarkable. No acute infarct is demonstrated.

Vascular: No hyperdense vessel. There is slight calcification in the
right carotid siphon. No other abnormal calcification evident.

Skull: There is a burr hole in the right frontal bone. There is also
a defect for the shunt catheter in the left parietal lobe. No acute
fracture. No blastic or lytic bone lesions are evident.

Sinuses/Orbits: There is mucosal thickening in several ethmoid air
cells. Other visualized paranasal sinuses are clear. Orbits appear
symmetric bilaterally.

Other: Visualized mastoid air cells are clear.
IMPRESSION: Shunt catheter with tip just anteromedial to the frontal horn left
lateral ventricle. There is no ventriculomegaly. The ventricles are
normal in size and configuration.

Gray-white compartments appear unremarkable.  No mass or hemorrhage.

Postoperative bony changes. Mild mucosal thickening in several
ethmoid air cells. Minimal calcification in right carotid siphon.

## 2020-06-10 DIAGNOSIS — M5412 Radiculopathy, cervical region: Secondary | ICD-10-CM | POA: Diagnosis not present

## 2020-06-10 DIAGNOSIS — M4125 Other idiopathic scoliosis, thoracolumbar region: Secondary | ICD-10-CM | POA: Diagnosis not present

## 2020-06-10 DIAGNOSIS — G44321 Chronic post-traumatic headache, intractable: Secondary | ICD-10-CM | POA: Diagnosis not present

## 2020-06-10 DIAGNOSIS — M50322 Other cervical disc degeneration at C5-C6 level: Secondary | ICD-10-CM | POA: Diagnosis not present

## 2020-06-12 DIAGNOSIS — M5412 Radiculopathy, cervical region: Secondary | ICD-10-CM | POA: Diagnosis not present

## 2020-06-12 DIAGNOSIS — M50322 Other cervical disc degeneration at C5-C6 level: Secondary | ICD-10-CM | POA: Diagnosis not present

## 2020-06-12 DIAGNOSIS — M4125 Other idiopathic scoliosis, thoracolumbar region: Secondary | ICD-10-CM | POA: Diagnosis not present

## 2020-06-12 DIAGNOSIS — G44321 Chronic post-traumatic headache, intractable: Secondary | ICD-10-CM | POA: Diagnosis not present

## 2020-06-13 DIAGNOSIS — M4125 Other idiopathic scoliosis, thoracolumbar region: Secondary | ICD-10-CM | POA: Diagnosis not present

## 2020-06-13 DIAGNOSIS — M50322 Other cervical disc degeneration at C5-C6 level: Secondary | ICD-10-CM | POA: Diagnosis not present

## 2020-06-13 DIAGNOSIS — M5412 Radiculopathy, cervical region: Secondary | ICD-10-CM | POA: Diagnosis not present

## 2020-06-13 DIAGNOSIS — G44321 Chronic post-traumatic headache, intractable: Secondary | ICD-10-CM | POA: Diagnosis not present

## 2020-06-16 DIAGNOSIS — M4125 Other idiopathic scoliosis, thoracolumbar region: Secondary | ICD-10-CM | POA: Diagnosis not present

## 2020-06-16 DIAGNOSIS — M50322 Other cervical disc degeneration at C5-C6 level: Secondary | ICD-10-CM | POA: Diagnosis not present

## 2020-06-16 DIAGNOSIS — G44321 Chronic post-traumatic headache, intractable: Secondary | ICD-10-CM | POA: Diagnosis not present

## 2020-06-16 DIAGNOSIS — M5412 Radiculopathy, cervical region: Secondary | ICD-10-CM | POA: Diagnosis not present

## 2020-06-17 DIAGNOSIS — M5412 Radiculopathy, cervical region: Secondary | ICD-10-CM | POA: Diagnosis not present

## 2020-06-17 DIAGNOSIS — M50322 Other cervical disc degeneration at C5-C6 level: Secondary | ICD-10-CM | POA: Diagnosis not present

## 2020-06-17 DIAGNOSIS — G44321 Chronic post-traumatic headache, intractable: Secondary | ICD-10-CM | POA: Diagnosis not present

## 2020-06-17 DIAGNOSIS — M4125 Other idiopathic scoliosis, thoracolumbar region: Secondary | ICD-10-CM | POA: Diagnosis not present

## 2020-06-18 DIAGNOSIS — M5412 Radiculopathy, cervical region: Secondary | ICD-10-CM | POA: Diagnosis not present

## 2020-06-18 DIAGNOSIS — M4125 Other idiopathic scoliosis, thoracolumbar region: Secondary | ICD-10-CM | POA: Diagnosis not present

## 2020-06-18 DIAGNOSIS — G44321 Chronic post-traumatic headache, intractable: Secondary | ICD-10-CM | POA: Diagnosis not present

## 2020-06-18 DIAGNOSIS — M50322 Other cervical disc degeneration at C5-C6 level: Secondary | ICD-10-CM | POA: Diagnosis not present

## 2020-07-01 DIAGNOSIS — M5412 Radiculopathy, cervical region: Secondary | ICD-10-CM | POA: Diagnosis not present

## 2020-07-01 DIAGNOSIS — M4125 Other idiopathic scoliosis, thoracolumbar region: Secondary | ICD-10-CM | POA: Diagnosis not present

## 2020-07-01 DIAGNOSIS — M50322 Other cervical disc degeneration at C5-C6 level: Secondary | ICD-10-CM | POA: Diagnosis not present

## 2020-07-01 DIAGNOSIS — G44321 Chronic post-traumatic headache, intractable: Secondary | ICD-10-CM | POA: Diagnosis not present

## 2020-07-02 DIAGNOSIS — G43009 Migraine without aura, not intractable, without status migrainosus: Secondary | ICD-10-CM | POA: Diagnosis not present

## 2020-07-03 DIAGNOSIS — M4125 Other idiopathic scoliosis, thoracolumbar region: Secondary | ICD-10-CM | POA: Diagnosis not present

## 2020-07-03 DIAGNOSIS — M50322 Other cervical disc degeneration at C5-C6 level: Secondary | ICD-10-CM | POA: Diagnosis not present

## 2020-07-03 DIAGNOSIS — G44321 Chronic post-traumatic headache, intractable: Secondary | ICD-10-CM | POA: Diagnosis not present

## 2020-07-03 DIAGNOSIS — M5412 Radiculopathy, cervical region: Secondary | ICD-10-CM | POA: Diagnosis not present

## 2020-07-04 ENCOUNTER — Other Ambulatory Visit: Payer: Self-pay

## 2020-07-04 ENCOUNTER — Ambulatory Visit (INDEPENDENT_AMBULATORY_CARE_PROVIDER_SITE_OTHER): Payer: BC Managed Care – PPO | Admitting: Family Medicine

## 2020-07-04 ENCOUNTER — Encounter: Payer: Self-pay | Admitting: Family Medicine

## 2020-07-04 VITALS — BP 135/80 | HR 90 | Temp 97.7°F | Ht 65.5 in | Wt 200.2 lb

## 2020-07-04 DIAGNOSIS — E669 Obesity, unspecified: Secondary | ICD-10-CM | POA: Diagnosis not present

## 2020-07-04 DIAGNOSIS — R41840 Attention and concentration deficit: Secondary | ICD-10-CM

## 2020-07-04 DIAGNOSIS — E7800 Pure hypercholesterolemia, unspecified: Secondary | ICD-10-CM

## 2020-07-04 DIAGNOSIS — Z0000 Encounter for general adult medical examination without abnormal findings: Secondary | ICD-10-CM

## 2020-07-04 DIAGNOSIS — Z23 Encounter for immunization: Secondary | ICD-10-CM

## 2020-07-04 DIAGNOSIS — Z0001 Encounter for general adult medical examination with abnormal findings: Secondary | ICD-10-CM | POA: Diagnosis not present

## 2020-07-04 DIAGNOSIS — Z125 Encounter for screening for malignant neoplasm of prostate: Secondary | ICD-10-CM | POA: Diagnosis not present

## 2020-07-04 DIAGNOSIS — Z6832 Body mass index (BMI) 32.0-32.9, adult: Secondary | ICD-10-CM

## 2020-07-04 DIAGNOSIS — G43009 Migraine without aura, not intractable, without status migrainosus: Secondary | ICD-10-CM

## 2020-07-04 LAB — LIPID PANEL

## 2020-07-04 NOTE — Patient Instructions (Addendum)
I will complete paperwork today, but if any continued difficulty with focus or attention, I do want you to meet with psychologist and look into other testing or evaluation. Follow up sooner if any worsening symptoms.   Keep up the good work with watching diet, exercise.  I have listed a few options below for weight loss specialist, but would recommend discussing with them if phentermine is prescribed or a reasonable option for you. Medical Weight Loss Management  . [place]: Address: [address] [place], [place] [address] Phone: [phone]  Without further details it is difficult to advise you on the HPV question, but would recommend wearing condoms to lessen chance of transmission if you do become sexually active with that partner.   First shingles vaccine today, repeat in 2 to 6 months.  Let me know if there are questions on the COVID-vaccine.  Thank you for coming in today.  Recheck 3 months. Keeping you healthy  Get these tests  Blood pressure- Have your blood pressure checked once a year by your healthcare provider.  Normal blood pressure is 120/80  Weight- Have your body mass index (BMI) calculated to screen for obesity.  BMI is a measure of body fat based on height and weight. You can also calculate your own BMI at [email or web address].  Cholesterol- Have your cholesterol checked every year.  Diabetes- Have your blood sugar checked regularly if you have high blood pressure, high cholesterol, have a family history of diabetes or if you are overweight.  Screening for Colon Cancer- Colonoscopy starting at age 73.  Screening may begin sooner depending on your family history and other health conditions. Follow up colonoscopy as directed by your Gastroenterologist.  Screening for Prostate Cancer- Both blood work (PSA) and a rectal exam help screen for Prostate Cancer.  Screening begins at age 63 with African-American men and at age 58 with Caucasian men.   Screening may begin sooner depending on your family history.  Take these medicines  Aspirin- One aspirin daily can help prevent Heart disease and Stroke.  Flu shot- Every fall.  Tetanus- Every 10 years.  Zostavax- Once after the age of 1 to prevent Shingles.  Pneumonia shot- Once after the age of 57; if you are younger than 58, ask your healthcare provider if you need a Pneumonia shot.  Take these steps  Don't smoke- If you do smoke, talk to your doctor about quitting.  For tips on how to quit, go to www.smokefree.gov or call 1-800-QUIT-NOW.  Be physically active- Exercise 5 days a week for at least 30 minutes.  If you are not already physically active start slow and gradually work up to 30 minutes of moderate physical activity.  Examples of moderate activity include walking briskly, mowing the yard, dancing, swimming, bicycling, etc.  Eat a healthy diet- Eat a variety of healthy food such as fruits, vegetables, low fat milk, low fat cheese, yogurt, lean meant, poultry, fish, beans, tofu, etc. For more information go to www.thenutritionsource.org  Drink alcohol in moderation- Limit alcohol intake to less than two drinks a day. Never drink and drive.  Dentist- Brush and floss twice daily; visit your dentist twice a year.  Depression- Your emotional health is as important as your physical health. If you're feeling down, or losing interest in things you would normally enjoy please talk to your healthcare provider.  Eye exam- Visit your eye doctor every year.  Safe sex- If you may be exposed to a sexually  transmitted infection, use a condom.  Seat belts- Seat belts can save your life; always wear one.  Smoke/Carbon Monoxide detectors- These detectors need to be installed on the appropriate level of your home.  Replace batteries at least once a year.  Skin cancer- When out in the sun, cover up and use sunscreen 15 SPF or higher.  Violence- If anyone is threatening you, please tell  your healthcare provider.  Living Will/ Health care power of attorney- Speak with your healthcare provider and family.  If you have lab work done today you will be contacted with your lab results within the next 2 weeks.  If you have not heard from [place] then please contact us. The fastest way to get your results is to register for My Chart.   IF you received an x-ray today, you will receive an invoice from [facility]. Please contact [facility] at [phone] with questions or concerns regarding your invoice.   IF you received labwork today, you will receive an invoice from [place]. Please contact [facility] at [phone] with questions or concerns regarding your invoice.   Our billing staff will not be able to assist you with questions regarding bills from these companies.  You will be contacted with the lab results as soon as they are available. The fastest way to get your results is to activate your My Chart account. Instructions are located on the last page of this paperwork. If you have not heard from [place] regarding the results in 2 weeks, please contact this office.

## 2020-07-04 NOTE — Progress Notes (Signed)
Subjective:  Patient ID: Carlos Costa, male    DOB: 27-Feb-1971  Age: 50 y.o. MRN: [id number]  CC:  Chief Complaint  Patient presents with  . Annual Exam    Patient would like to discuss loosing weight. Patient also have an accomodation form he would like to have filled out for work.     HPI Carlos Costa presents for  Annual physical exam as well as concerns above. Last physical January 2021.  History of migraine headaches without aura History of obstructive hydrocephalus followed by [place] neurology.  Thought to have stress components of headache previously, adjustment disorder discussed last year.  Symptoms were improving at last years physical with new job that was less stressful. Last migraine 1 month ago. Saw Dr. Bjorn Loser 07/02/20. Possible nec tightness onset, then migraine. Rx tizanidine if needed and maxalt for migraine if needed.   Still some difficulty with focus/concentration since wife's passing - September 4th 2020. Went through 2 13 week sessions for grief share. Overall feels better.   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 Feels like he could have done better if had more time. 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 Has form for accommodations for extended testing time.  Denies daily issues with focus/concentration, feels like he has done well since last year. Still some trouble focusing at times, but minimal and not affecting work. When studying for test, trouble staying on task after 20-30 min. Declines ADD testing/psychology eval at this time.   Hyperlipidemia: No current medications.  ASCVD risk score 2.5% last year. No immediate family history of heart disease. Lab Results  Component Value Date   CHOL 228 (H) 07/04/2019   HDL 74 07/04/2019   LDLCALC 134 (H) 07/04/2019   TRIG 112 07/04/2019   CHOLHDL 3.1 07/04/2019   Lab Results  Component Value Date   ALT 24 07/04/2019   AST 18 07/04/2019    ALKPHOS 84 07/04/2019   BILITOT 0.4 07/04/2019   Cancer screening: Colonoscopy 06/11/2019, 2 polyps, Dr. Loletha Carrow, repeat 5 years Prostate  - no FH.  Lab Results  Component Value Date   PSA 0.80 11/24/2012  The natural history of prostate cancer and ongoing controversy regarding screening and potential treatment outcomes of prostate cancer has been discussed with the patient. The meaning of a false positive PSA and a false negative PSA has been discussed. He indicates understanding of the limitations of this screening test and wishes to proceed with screening PSA testing only, on DRE.   Immunization History  Administered Date(s) Administered  . Influenza,inj,Quad PF,6+ Mos 05/18/2019  . Tdap 07/04/2020  COVID-19 vaccine: did not receive. Does not plan on getting vaccine, no questions. Shingles vaccine: agrees - today.  Flu vaccine: declines Tdap updated today.  Depression screen [facility] 2/9 07/04/2020 07/04/2019 05/18/2019  Decreased Interest 0 0 1  Down, Depressed, Hopeless 0 0 0  PHQ - 2 Score 0 0 1    Hearing Screening   125Hz  250Hz  500Hz  1000Hz  2000Hz  3000Hz  4000Hz  6000Hz  8000Hz   Right ear:           Left ear:             Visual Acuity Screening   Right eye Left eye Both eyes  Without correction: 20/20 20/20 20/20   With correction:     Last ophthalmology eval: appt this Monday  Dental: Every 3 months.   Exercise/obesity: Weight down 4 pounds from last year.  Would like some assistance  with weight loss. Watching diet.  Current exercise: min walking in evenings - 3-4 days per week, 46miles, 6min.  Fast food/restaurant food/takeout: minimal. Mostly eating at home Breakfast - most days.  Sugar containing beverages: no.  Did have some benefit with phentermine.   Body mass index is 32.81 kg/m. Wt Readings from Last 3 Encounters:  07/04/20 200 lb 3.2 oz (90.8 kg)  07/04/19 204 lb 3.2 oz (92.6 kg)  06/11/19 202 lb (91.6 kg)    Partner had test with positive HPV test. Unknown  other details, not sexually active yet.   History Patient Active Problem List   Diagnosis Date Noted  . Migraine without aura and without status migrainosus, not intractable 10/26/2017  . Nausea 10/26/2017  . Localized maculopapular rash 01/29/2014   Past Medical History:  Diagnosis Date  . GERD (gastroesophageal reflux disease)   . Hepatitis C   . Hydrocephalus ([place])   . Migraine    Past Surgical History:  Procedure Laterality Date  . BRAIN SURGERY  1988  . COLONOSCOPY  06/11/2019  . hydrocephalus     No Known Allergies Prior to Admission medications   Medication Sig Start Date End Date Taking? Authorizing Provider  Cholecalciferol (VITAMIN D3) 100000 UNIT/GM POWD Take by mouth.   Yes [provider]  glucosamine-chondroitin 500-400 MG tablet Take by mouth.   Yes [provider]  Multiple Vitamins-Minerals (MULTIVITAMIN WITH MINERALS) tablet Take 1 tablet by mouth daily.   Yes [provider]   Social History   Socioeconomic History  . Marital status: Widowed    Spouse name: Not on file  . Number of children: Not on file  . Years of education: Not on file  . Highest education level: Not on file  Occupational History  . Not on file  Tobacco Use  . Smoking status: Former Smoker    Years: 23.00    Quit date: 2013    Years since quitting: 9.0  . Smokeless tobacco: Former Systems developer    Quit date: 1989  Vaping Use  . Vaping Use: Every day  Substance and Sexual Activity  . Alcohol use: Yes    Alcohol/week: 1.0 standard drink    Types: 1 Cans of beer per week  . Drug use: No  . Sexual activity: Yes  Other Topics Concern  . Not on file  Social History Narrative  . Not on file   Social Determinants of Health   Financial Resource Strain: Not on file  Food Insecurity: Not on file  Transportation Needs: Not on file  Physical Activity: Not on file  Stress: Not on file  Social Connections: Not on file  Intimate Partner Violence: Not on file     Review of Systems 13 point review of systems per patient health survey noted.  Negative other than as indicated above or in HPI.    Objective:   Vitals:   07/04/20 0857  BP: 135/80  Pulse: 90  Temp: 97.7 F (36.5 C)  TempSrc: Temporal  SpO2: 97%  Weight: 200 lb 3.2 oz (90.8 kg)  Height: 5' 5.5" (1.664 m)     Physical Exam Vitals reviewed.  Constitutional:      Appearance: He is well-developed.  HENT:     Head: Normocephalic and atraumatic.     Right Ear: External ear normal.     Left Ear: External ear normal.  Eyes:     Conjunctiva/sclera: Conjunctivae normal.     Pupils: Pupils are equal, round, and reactive to light.  Neck:     Thyroid: No thyromegaly.  Cardiovascular:     Rate and Rhythm: Normal rate and regular rhythm.     Heart sounds: Normal heart sounds.  Pulmonary:     Effort: Pulmonary effort is normal. No respiratory distress.     Breath sounds: Normal breath sounds. No wheezing.  Abdominal:     General: There is no distension.     Palpations: Abdomen is soft.     Tenderness: There is no abdominal tenderness.  Musculoskeletal:        General: No tenderness. Normal range of motion.     Cervical back: Normal range of motion and neck supple.  Lymphadenopathy:     Cervical: No cervical adenopathy.  Skin:    General: Skin is warm and dry.  Neurological:     Mental Status: He is alert and oriented to person, place, and time.     Deep Tendon Reflexes: Reflexes are normal and symmetric.  Psychiatric:        Behavior: Behavior normal.      Assessment & Plan:  Carlos Costa is a 50 y.o. male . Annual physical exam  - -anticipatory guidance as below in AVS, screening labs above. Health maintenance items as above in HPI discussed/recommended as applicable.   Need for prophylactic vaccination with combined diphtheria-tetanus-pertussis (DTP) vaccine - Plan: Tdap vaccine greater than or equal to 7yo IM  Elevated LDL cholesterol level - Plan: Lipid  Panel, Comprehensive metabolic panel  -Check labs.  Difficulty concentrating  -Note provided to his employer for repeat testing with more time.  Suspect still component of adjustment disorder versus underlying focus difficulty.  Discussed psychology eval/further testing but deferred at this time.  If persistent symptoms would recommend further testing/evaluation.  Screening for prostate cancer We discussed pros and cons of prostate cancer screening, and after this discussion, he chose to have screening done with PSA only   Need for shingles vaccine  -First Shingrix given.  Class 1 obesity without serious comorbidity with body mass index (BMI) of 32.0 to 32.9 in adult, unspecified obesity type  -Commended on diet, exercise.  Phone numbers provided for local medical weight loss offices.  Can discuss treatments including phentermine as option with those providers.  No orders of the defined types were placed in this encounter.  Patient Instructions     I will complete paperwork today, but if any continued difficulty with focus or attention, I do want you to meet with psychologist and look into other testing or evaluation. Follow up sooner if any worsening symptoms.   Keep up the good work with watching diet, exercise.  I have listed a few options below for weight loss specialist, but would recommend discussing with them if phentermine is prescribed or a reasonable option for you. Medical Weight Loss Management  . [place]: Address: [address] [place], [place] [address] Phone: [phone]  Without further details it is difficult to advise you on the HPV question, but would recommend wearing condoms to lessen chance of transmission if you do become sexually active with that partner.   First shingles vaccine today, repeat in 2 to 6 months.  Let me know if there are questions on the COVID-vaccine.  Thank you for coming in today.  Recheck 3 months. Keeping you  healthy  Get these tests  Blood pressure- Have your blood pressure checked once a year by your healthcare provider.  Normal blood pressure is 120/80  Weight- Have  your body mass index (BMI) calculated to screen for obesity.  BMI is a measure of body fat based on height and weight. You can also calculate your own BMI at [email or web address].  Cholesterol- Have your cholesterol checked every year.  Diabetes- Have your blood sugar checked regularly if you have high blood pressure, high cholesterol, have a family history of diabetes or if you are overweight.  Screening for Colon Cancer- Colonoscopy starting at age 41.  Screening may begin sooner depending on your family history and other health conditions. Follow up colonoscopy as directed by your Gastroenterologist.  Screening for Prostate Cancer- Both blood work (PSA) and a rectal exam help screen for Prostate Cancer.  Screening begins at age 81 with African-American men and at age 24 with Caucasian men.  Screening may begin sooner depending on your family history.  Take these medicines  Aspirin- One aspirin daily can help prevent Heart disease and Stroke.  Flu shot- Every fall.  Tetanus- Every 10 years.  Zostavax- Once after the age of 15 to prevent Shingles.  Pneumonia shot- Once after the age of 23; if you are younger than 16, ask your healthcare provider if you need a Pneumonia shot.  Take these steps  Don't smoke- If you do smoke, talk to your doctor about quitting.  For tips on how to quit, go to www.smokefree.gov or call 1-800-QUIT-NOW.  Be physically active- Exercise 5 days a week for at least 30 minutes.  If you are not already physically active start slow and gradually work up to 30 minutes of moderate physical activity.  Examples of moderate activity include walking briskly, mowing the yard, dancing, swimming, bicycling, etc.  Eat a healthy diet- Eat a variety of healthy food such as fruits, vegetables, low fat milk,  low fat cheese, yogurt, lean meant, poultry, fish, beans, tofu, etc. For more information go to www.thenutritionsource.org  Drink alcohol in moderation- Limit alcohol intake to less than two drinks a day. Never drink and drive.  Dentist- Brush and floss twice daily; visit your dentist twice a year.  Depression- Your emotional health is as important as your physical health. If you're feeling down, or losing interest in things you would normally enjoy please talk to your healthcare provider.  Eye exam- Visit your eye doctor every year.  Safe sex- If you may be exposed to a sexually transmitted infection, use a condom.  Seat belts- Seat belts can save your life; always wear one.  Smoke/Carbon Monoxide detectors- These detectors need to be installed on the appropriate level of your home.  Replace batteries at least once a year.  Skin cancer- When out in the sun, cover up and use sunscreen 15 SPF or higher.  Violence- If anyone is threatening you, please tell your healthcare provider.  Living Will/ Health care power of attorney- Speak with your healthcare provider and family.  If you have lab work done today you will be contacted with your lab results within the next 2 weeks.  If you have not heard from [place] then please contact us. The fastest way to get your results is to register for My Chart.   IF you received an x-ray today, you will receive an invoice from [facility]. Please contact [facility] at [phone] with questions or concerns regarding your invoice.   IF you received labwork today, you will receive an invoice from [facility]. Please contact [facility] at [phone] with questions or concerns regarding your invoice.   Our billing staff will not be able  to assist you with questions regarding bills from these companies.  You will be contacted with the lab results as soon as they are available. The fastest way to get your results is to activate your My Chart  account. Instructions are located on the last page of this paperwork. If you have not heard from [place] regarding the results in 2 weeks, please contact this office.          Signed, Merri Ray, MD [facility]

## 2020-07-05 LAB — LIPID PANEL
Chol/HDL Ratio: 3.4 ratio (ref 0.0–5.0)
Cholesterol, Total: 212 mg/dL — ABNORMAL HIGH (ref 100–199)
HDL: 62 mg/dL (ref >39)
LDL Chol Calc (NIH): 128 mg/dL — ABNORMAL HIGH (ref 0–99)
Triglycerides: 123 mg/dL (ref 0–149)
VLDL Cholesterol Cal: 22 mg/dL (ref 5–40)

## 2020-07-05 LAB — COMPREHENSIVE METABOLIC PANEL
ALT: 36 IU/L (ref 0–44)
AST: 25 IU/L (ref 0–40)
Albumin/Globulin Ratio: 1.8 (ref 1.2–2.2)
Albumin: 4.7 g/dL (ref 3.8–4.9)
Alkaline Phosphatase: 93 IU/L (ref 44–121)
BUN/Creatinine Ratio: 8 — ABNORMAL LOW (ref 9–20)
BUN: 10 mg/dL (ref 6–24)
Bilirubin Total: 0.6 mg/dL (ref 0.0–1.2)
CO2: 22 mmol/L (ref 20–29)
Calcium: 9.7 mg/dL (ref 8.7–10.2)
Chloride: 99 mmol/L (ref 96–106)
Creatinine, Ser: 1.19 mg/dL (ref 0.76–1.27)
GFR calc Af Amer: 81 mL/min/{1.73_m2} (ref >59)
GFR calc non Af Amer: 70 mL/min/{1.73_m2} (ref >59)
Globulin, Total: 2.6 g/dL (ref 1.5–4.5)
Glucose: 96 mg/dL (ref 65–99)
Potassium: 4.8 mmol/L (ref 3.5–5.2)
Sodium: 137 mmol/L (ref 134–144)
Total Protein: 7.3 g/dL (ref 6.0–8.5)

## 2020-07-05 LAB — PSA: Prostate Specific Ag, Serum: 2.5 ng/mL (ref 0.0–4.0)

## 2020-07-07 DIAGNOSIS — H5021 Vertical strabismus, right eye: Secondary | ICD-10-CM | POA: Diagnosis not present

## 2020-07-07 DIAGNOSIS — H53451 Other localized visual field defect, right eye: Secondary | ICD-10-CM | POA: Diagnosis not present

## 2020-07-07 DIAGNOSIS — H50112 Monocular exotropia, left eye: Secondary | ICD-10-CM | POA: Diagnosis not present

## 2020-07-07 DIAGNOSIS — H532 Diplopia: Secondary | ICD-10-CM | POA: Diagnosis not present

## 2020-07-07 DIAGNOSIS — H40023 Open angle with borderline findings, high risk, bilateral: Secondary | ICD-10-CM | POA: Diagnosis not present

## 2020-07-07 DIAGNOSIS — H5213 Myopia, bilateral: Secondary | ICD-10-CM | POA: Diagnosis not present

## 2020-07-08 DIAGNOSIS — M5412 Radiculopathy, cervical region: Secondary | ICD-10-CM | POA: Diagnosis not present

## 2020-07-08 DIAGNOSIS — G44321 Chronic post-traumatic headache, intractable: Secondary | ICD-10-CM | POA: Diagnosis not present

## 2020-07-08 DIAGNOSIS — M50322 Other cervical disc degeneration at C5-C6 level: Secondary | ICD-10-CM | POA: Diagnosis not present

## 2020-07-08 DIAGNOSIS — M4125 Other idiopathic scoliosis, thoracolumbar region: Secondary | ICD-10-CM | POA: Diagnosis not present

## 2020-07-10 DIAGNOSIS — M4125 Other idiopathic scoliosis, thoracolumbar region: Secondary | ICD-10-CM | POA: Diagnosis not present

## 2020-07-10 DIAGNOSIS — G44321 Chronic post-traumatic headache, intractable: Secondary | ICD-10-CM | POA: Diagnosis not present

## 2020-07-10 DIAGNOSIS — M5412 Radiculopathy, cervical region: Secondary | ICD-10-CM | POA: Diagnosis not present

## 2020-07-10 DIAGNOSIS — M50322 Other cervical disc degeneration at C5-C6 level: Secondary | ICD-10-CM | POA: Diagnosis not present

## 2020-07-15 DIAGNOSIS — M5412 Radiculopathy, cervical region: Secondary | ICD-10-CM | POA: Diagnosis not present

## 2020-07-15 DIAGNOSIS — G44321 Chronic post-traumatic headache, intractable: Secondary | ICD-10-CM | POA: Diagnosis not present

## 2020-07-15 DIAGNOSIS — M50322 Other cervical disc degeneration at C5-C6 level: Secondary | ICD-10-CM | POA: Diagnosis not present

## 2020-07-15 DIAGNOSIS — M4125 Other idiopathic scoliosis, thoracolumbar region: Secondary | ICD-10-CM | POA: Diagnosis not present

## 2020-07-17 DIAGNOSIS — G44321 Chronic post-traumatic headache, intractable: Secondary | ICD-10-CM | POA: Diagnosis not present

## 2020-07-17 DIAGNOSIS — M4125 Other idiopathic scoliosis, thoracolumbar region: Secondary | ICD-10-CM | POA: Diagnosis not present

## 2020-07-17 DIAGNOSIS — M50322 Other cervical disc degeneration at C5-C6 level: Secondary | ICD-10-CM | POA: Diagnosis not present

## 2020-07-17 DIAGNOSIS — M5412 Radiculopathy, cervical region: Secondary | ICD-10-CM | POA: Diagnosis not present

## 2020-07-24 DIAGNOSIS — M50322 Other cervical disc degeneration at C5-C6 level: Secondary | ICD-10-CM | POA: Diagnosis not present

## 2020-07-24 DIAGNOSIS — M4125 Other idiopathic scoliosis, thoracolumbar region: Secondary | ICD-10-CM | POA: Diagnosis not present

## 2020-07-24 DIAGNOSIS — G44321 Chronic post-traumatic headache, intractable: Secondary | ICD-10-CM | POA: Diagnosis not present

## 2020-07-24 DIAGNOSIS — M5412 Radiculopathy, cervical region: Secondary | ICD-10-CM | POA: Diagnosis not present

## 2020-07-28 DIAGNOSIS — M5412 Radiculopathy, cervical region: Secondary | ICD-10-CM | POA: Diagnosis not present

## 2020-07-28 DIAGNOSIS — G44321 Chronic post-traumatic headache, intractable: Secondary | ICD-10-CM | POA: Diagnosis not present

## 2020-07-28 DIAGNOSIS — M50322 Other cervical disc degeneration at C5-C6 level: Secondary | ICD-10-CM | POA: Diagnosis not present

## 2020-07-28 DIAGNOSIS — M4125 Other idiopathic scoliosis, thoracolumbar region: Secondary | ICD-10-CM | POA: Diagnosis not present

## 2020-08-05 DIAGNOSIS — M5412 Radiculopathy, cervical region: Secondary | ICD-10-CM | POA: Diagnosis not present

## 2020-08-05 DIAGNOSIS — M4125 Other idiopathic scoliosis, thoracolumbar region: Secondary | ICD-10-CM | POA: Diagnosis not present

## 2020-08-05 DIAGNOSIS — M50322 Other cervical disc degeneration at C5-C6 level: Secondary | ICD-10-CM | POA: Diagnosis not present

## 2020-08-05 DIAGNOSIS — G44321 Chronic post-traumatic headache, intractable: Secondary | ICD-10-CM | POA: Diagnosis not present

## 2020-08-11 DIAGNOSIS — M5412 Radiculopathy, cervical region: Secondary | ICD-10-CM | POA: Diagnosis not present

## 2020-08-11 DIAGNOSIS — G44321 Chronic post-traumatic headache, intractable: Secondary | ICD-10-CM | POA: Diagnosis not present

## 2020-08-11 DIAGNOSIS — M4125 Other idiopathic scoliosis, thoracolumbar region: Secondary | ICD-10-CM | POA: Diagnosis not present

## 2020-08-11 DIAGNOSIS — M50322 Other cervical disc degeneration at C5-C6 level: Secondary | ICD-10-CM | POA: Diagnosis not present

## 2020-08-18 DIAGNOSIS — M4125 Other idiopathic scoliosis, thoracolumbar region: Secondary | ICD-10-CM | POA: Diagnosis not present

## 2020-08-18 DIAGNOSIS — G44321 Chronic post-traumatic headache, intractable: Secondary | ICD-10-CM | POA: Diagnosis not present

## 2020-08-18 DIAGNOSIS — M5412 Radiculopathy, cervical region: Secondary | ICD-10-CM | POA: Diagnosis not present

## 2020-08-18 DIAGNOSIS — M50322 Other cervical disc degeneration at C5-C6 level: Secondary | ICD-10-CM | POA: Diagnosis not present

## 2020-08-25 DIAGNOSIS — G44321 Chronic post-traumatic headache, intractable: Secondary | ICD-10-CM | POA: Diagnosis not present

## 2020-08-25 DIAGNOSIS — M50322 Other cervical disc degeneration at C5-C6 level: Secondary | ICD-10-CM | POA: Diagnosis not present

## 2020-08-25 DIAGNOSIS — M5412 Radiculopathy, cervical region: Secondary | ICD-10-CM | POA: Diagnosis not present

## 2020-08-25 DIAGNOSIS — M4125 Other idiopathic scoliosis, thoracolumbar region: Secondary | ICD-10-CM | POA: Diagnosis not present

## 2020-09-01 DIAGNOSIS — M50322 Other cervical disc degeneration at C5-C6 level: Secondary | ICD-10-CM | POA: Diagnosis not present

## 2020-09-01 DIAGNOSIS — M5412 Radiculopathy, cervical region: Secondary | ICD-10-CM | POA: Diagnosis not present

## 2020-09-01 DIAGNOSIS — M4125 Other idiopathic scoliosis, thoracolumbar region: Secondary | ICD-10-CM | POA: Diagnosis not present

## 2020-09-01 DIAGNOSIS — G44321 Chronic post-traumatic headache, intractable: Secondary | ICD-10-CM | POA: Diagnosis not present

## 2020-09-15 DIAGNOSIS — M50322 Other cervical disc degeneration at C5-C6 level: Secondary | ICD-10-CM | POA: Diagnosis not present

## 2020-09-15 DIAGNOSIS — M4125 Other idiopathic scoliosis, thoracolumbar region: Secondary | ICD-10-CM | POA: Diagnosis not present

## 2020-09-15 DIAGNOSIS — G44321 Chronic post-traumatic headache, intractable: Secondary | ICD-10-CM | POA: Diagnosis not present

## 2020-09-15 DIAGNOSIS — M5412 Radiculopathy, cervical region: Secondary | ICD-10-CM | POA: Diagnosis not present

## 2020-09-24 DIAGNOSIS — H53431 Sector or arcuate defects, right eye: Secondary | ICD-10-CM | POA: Diagnosis not present

## 2020-09-24 DIAGNOSIS — Z8669 Personal history of other diseases of the nervous system and sense organs: Secondary | ICD-10-CM | POA: Diagnosis not present

## 2020-09-24 DIAGNOSIS — H47093 Other disorders of optic nerve, not elsewhere classified, bilateral: Secondary | ICD-10-CM | POA: Diagnosis not present

## 2020-09-24 DIAGNOSIS — H532 Diplopia: Secondary | ICD-10-CM | POA: Diagnosis not present

## 2020-10-02 ENCOUNTER — Ambulatory Visit: Payer: Self-pay | Admitting: Family Medicine

## 2020-10-21 DIAGNOSIS — G44321 Chronic post-traumatic headache, intractable: Secondary | ICD-10-CM | POA: Diagnosis not present

## 2020-10-21 DIAGNOSIS — M50322 Other cervical disc degeneration at C5-C6 level: Secondary | ICD-10-CM | POA: Diagnosis not present

## 2020-10-21 DIAGNOSIS — M4125 Other idiopathic scoliosis, thoracolumbar region: Secondary | ICD-10-CM | POA: Diagnosis not present

## 2020-10-21 DIAGNOSIS — M5412 Radiculopathy, cervical region: Secondary | ICD-10-CM | POA: Diagnosis not present

## 2020-12-10 DIAGNOSIS — H5231 Anisometropia: Secondary | ICD-10-CM | POA: Diagnosis not present

## 2020-12-10 DIAGNOSIS — G911 Obstructive hydrocephalus: Secondary | ICD-10-CM | POA: Diagnosis not present

## 2020-12-10 DIAGNOSIS — H4911 Fourth [trochlear] nerve palsy, right eye: Secondary | ICD-10-CM | POA: Diagnosis not present

## 2020-12-10 DIAGNOSIS — H52223 Regular astigmatism, bilateral: Secondary | ICD-10-CM | POA: Diagnosis not present

## 2020-12-10 DIAGNOSIS — H5213 Myopia, bilateral: Secondary | ICD-10-CM | POA: Diagnosis not present

## 2021-02-11 DIAGNOSIS — H4911 Fourth [trochlear] nerve palsy, right eye: Secondary | ICD-10-CM | POA: Diagnosis not present

## 2021-02-11 DIAGNOSIS — H5030 Unspecified intermittent heterotropia: Secondary | ICD-10-CM | POA: Diagnosis not present

## 2021-02-11 DIAGNOSIS — H52223 Regular astigmatism, bilateral: Secondary | ICD-10-CM | POA: Diagnosis not present

## 2021-02-11 DIAGNOSIS — H5213 Myopia, bilateral: Secondary | ICD-10-CM | POA: Diagnosis not present

## 2021-02-11 DIAGNOSIS — H5231 Anisometropia: Secondary | ICD-10-CM | POA: Diagnosis not present

## 2021-04-13 DIAGNOSIS — H5231 Anisometropia: Secondary | ICD-10-CM | POA: Diagnosis not present

## 2021-04-13 DIAGNOSIS — H5030 Unspecified intermittent heterotropia: Secondary | ICD-10-CM | POA: Diagnosis not present

## 2021-04-13 DIAGNOSIS — H52223 Regular astigmatism, bilateral: Secondary | ICD-10-CM | POA: Diagnosis not present

## 2021-04-13 DIAGNOSIS — H4911 Fourth [trochlear] nerve palsy, right eye: Secondary | ICD-10-CM | POA: Diagnosis not present

## 2021-04-13 DIAGNOSIS — H5021 Vertical strabismus, right eye: Secondary | ICD-10-CM | POA: Diagnosis not present

## 2021-04-13 DIAGNOSIS — H5010 Unspecified exotropia: Secondary | ICD-10-CM | POA: Diagnosis not present

## 2021-04-13 DIAGNOSIS — H5213 Myopia, bilateral: Secondary | ICD-10-CM | POA: Diagnosis not present

## 2021-04-13 DIAGNOSIS — H532 Diplopia: Secondary | ICD-10-CM | POA: Diagnosis not present

## 2021-06-04 DIAGNOSIS — M5412 Radiculopathy, cervical region: Secondary | ICD-10-CM | POA: Diagnosis not present

## 2021-06-04 DIAGNOSIS — G44321 Chronic post-traumatic headache, intractable: Secondary | ICD-10-CM | POA: Diagnosis not present

## 2021-06-04 DIAGNOSIS — M50322 Other cervical disc degeneration at C5-C6 level: Secondary | ICD-10-CM | POA: Diagnosis not present

## 2021-06-04 DIAGNOSIS — M4125 Other idiopathic scoliosis, thoracolumbar region: Secondary | ICD-10-CM | POA: Diagnosis not present

## 2021-06-11 DIAGNOSIS — M4004 Postural kyphosis, thoracic region: Secondary | ICD-10-CM | POA: Diagnosis not present

## 2021-06-11 DIAGNOSIS — M9901 Segmental and somatic dysfunction of cervical region: Secondary | ICD-10-CM | POA: Diagnosis not present

## 2021-06-11 DIAGNOSIS — G43109 Migraine with aura, not intractable, without status migrainosus: Secondary | ICD-10-CM | POA: Diagnosis not present

## 2021-06-11 DIAGNOSIS — M9902 Segmental and somatic dysfunction of thoracic region: Secondary | ICD-10-CM | POA: Diagnosis not present

## 2021-06-15 DIAGNOSIS — M9901 Segmental and somatic dysfunction of cervical region: Secondary | ICD-10-CM | POA: Diagnosis not present

## 2021-06-15 DIAGNOSIS — G43109 Migraine with aura, not intractable, without status migrainosus: Secondary | ICD-10-CM | POA: Diagnosis not present

## 2021-06-15 DIAGNOSIS — M9902 Segmental and somatic dysfunction of thoracic region: Secondary | ICD-10-CM | POA: Diagnosis not present

## 2021-06-15 DIAGNOSIS — M4004 Postural kyphosis, thoracic region: Secondary | ICD-10-CM | POA: Diagnosis not present

## 2021-07-02 DIAGNOSIS — G43109 Migraine with aura, not intractable, without status migrainosus: Secondary | ICD-10-CM | POA: Diagnosis not present

## 2021-07-02 DIAGNOSIS — M9901 Segmental and somatic dysfunction of cervical region: Secondary | ICD-10-CM | POA: Diagnosis not present

## 2021-07-02 DIAGNOSIS — Z79899 Other long term (current) drug therapy: Secondary | ICD-10-CM | POA: Diagnosis not present

## 2021-07-02 DIAGNOSIS — G43009 Migraine without aura, not intractable, without status migrainosus: Secondary | ICD-10-CM | POA: Diagnosis not present

## 2021-07-02 DIAGNOSIS — M4004 Postural kyphosis, thoracic region: Secondary | ICD-10-CM | POA: Diagnosis not present

## 2021-07-02 DIAGNOSIS — M9902 Segmental and somatic dysfunction of thoracic region: Secondary | ICD-10-CM | POA: Diagnosis not present

## 2021-07-08 DIAGNOSIS — H53451 Other localized visual field defect, right eye: Secondary | ICD-10-CM | POA: Diagnosis not present

## 2021-07-08 DIAGNOSIS — H532 Diplopia: Secondary | ICD-10-CM | POA: Diagnosis not present

## 2021-07-08 DIAGNOSIS — H40013 Open angle with borderline findings, low risk, bilateral: Secondary | ICD-10-CM | POA: Diagnosis not present

## 2021-07-08 DIAGNOSIS — G43009 Migraine without aura, not intractable, without status migrainosus: Secondary | ICD-10-CM | POA: Diagnosis not present

## 2021-07-16 DIAGNOSIS — M9901 Segmental and somatic dysfunction of cervical region: Secondary | ICD-10-CM | POA: Diagnosis not present

## 2021-07-16 DIAGNOSIS — M4004 Postural kyphosis, thoracic region: Secondary | ICD-10-CM | POA: Diagnosis not present

## 2021-07-16 DIAGNOSIS — M9902 Segmental and somatic dysfunction of thoracic region: Secondary | ICD-10-CM | POA: Diagnosis not present

## 2021-07-16 DIAGNOSIS — G43109 Migraine with aura, not intractable, without status migrainosus: Secondary | ICD-10-CM | POA: Diagnosis not present

## 2021-07-30 DIAGNOSIS — M4004 Postural kyphosis, thoracic region: Secondary | ICD-10-CM | POA: Diagnosis not present

## 2021-07-30 DIAGNOSIS — M9901 Segmental and somatic dysfunction of cervical region: Secondary | ICD-10-CM | POA: Diagnosis not present

## 2021-07-30 DIAGNOSIS — M9902 Segmental and somatic dysfunction of thoracic region: Secondary | ICD-10-CM | POA: Diagnosis not present

## 2021-07-30 DIAGNOSIS — G43109 Migraine with aura, not intractable, without status migrainosus: Secondary | ICD-10-CM | POA: Diagnosis not present

## 2021-08-20 DIAGNOSIS — M9901 Segmental and somatic dysfunction of cervical region: Secondary | ICD-10-CM | POA: Diagnosis not present

## 2021-08-20 DIAGNOSIS — G43109 Migraine with aura, not intractable, without status migrainosus: Secondary | ICD-10-CM | POA: Diagnosis not present

## 2021-08-20 DIAGNOSIS — M9902 Segmental and somatic dysfunction of thoracic region: Secondary | ICD-10-CM | POA: Diagnosis not present

## 2021-08-20 DIAGNOSIS — M4004 Postural kyphosis, thoracic region: Secondary | ICD-10-CM | POA: Diagnosis not present

## 2021-09-10 DIAGNOSIS — G43109 Migraine with aura, not intractable, without status migrainosus: Secondary | ICD-10-CM | POA: Diagnosis not present

## 2021-09-10 DIAGNOSIS — M9902 Segmental and somatic dysfunction of thoracic region: Secondary | ICD-10-CM | POA: Diagnosis not present

## 2021-09-10 DIAGNOSIS — M9901 Segmental and somatic dysfunction of cervical region: Secondary | ICD-10-CM | POA: Diagnosis not present

## 2021-09-10 DIAGNOSIS — M4004 Postural kyphosis, thoracic region: Secondary | ICD-10-CM | POA: Diagnosis not present

## 2022-01-04 DIAGNOSIS — Z13228 Encounter for screening for other metabolic disorders: Secondary | ICD-10-CM | POA: Diagnosis not present

## 2022-01-04 DIAGNOSIS — Z982 Presence of cerebrospinal fluid drainage device: Secondary | ICD-10-CM | POA: Diagnosis not present

## 2022-01-04 DIAGNOSIS — Z130 Encounter for screening for diseases of the blood and blood-forming organs and certain disorders involving the immune mechanism: Secondary | ICD-10-CM | POA: Diagnosis not present

## 2022-01-04 DIAGNOSIS — N5202 Corporo-venous occlusive erectile dysfunction: Secondary | ICD-10-CM | POA: Diagnosis not present

## 2022-01-04 DIAGNOSIS — E6609 Other obesity due to excess calories: Secondary | ICD-10-CM | POA: Diagnosis not present

## 2022-01-04 DIAGNOSIS — Z13220 Encounter for screening for lipoid disorders: Secondary | ICD-10-CM | POA: Diagnosis not present

## 2022-01-04 DIAGNOSIS — Z125 Encounter for screening for malignant neoplasm of prostate: Secondary | ICD-10-CM | POA: Diagnosis not present

## 2022-01-04 DIAGNOSIS — Z0001 Encounter for general adult medical examination with abnormal findings: Secondary | ICD-10-CM | POA: Diagnosis not present

## 2022-01-04 DIAGNOSIS — Z6833 Body mass index (BMI) 33.0-33.9, adult: Secondary | ICD-10-CM | POA: Diagnosis not present

## 2022-01-04 DIAGNOSIS — Z0000 Encounter for general adult medical examination without abnormal findings: Secondary | ICD-10-CM | POA: Diagnosis not present

## 2022-01-04 DIAGNOSIS — G910 Communicating hydrocephalus: Secondary | ICD-10-CM | POA: Diagnosis not present

## 2022-01-06 DIAGNOSIS — H53451 Other localized visual field defect, right eye: Secondary | ICD-10-CM | POA: Diagnosis not present

## 2022-01-06 DIAGNOSIS — H40013 Open angle with borderline findings, low risk, bilateral: Secondary | ICD-10-CM | POA: Diagnosis not present

## 2022-01-06 DIAGNOSIS — H532 Diplopia: Secondary | ICD-10-CM | POA: Diagnosis not present

## 2022-01-06 DIAGNOSIS — H5021 Vertical strabismus, right eye: Secondary | ICD-10-CM | POA: Diagnosis not present

## 2022-02-17 DIAGNOSIS — Z79899 Other long term (current) drug therapy: Secondary | ICD-10-CM | POA: Diagnosis not present

## 2022-02-17 DIAGNOSIS — G43009 Migraine without aura, not intractable, without status migrainosus: Secondary | ICD-10-CM | POA: Diagnosis not present

## 2022-02-17 DIAGNOSIS — G43109 Migraine with aura, not intractable, without status migrainosus: Secondary | ICD-10-CM | POA: Diagnosis not present

## 2022-06-10 ENCOUNTER — Telehealth: Payer: Self-pay | Admitting: *Deleted

## 2022-06-10 ENCOUNTER — Encounter: Payer: Self-pay | Admitting: *Deleted

## 2022-06-10 NOTE — Patient Instructions (Signed)
Visit Information  Thank you for taking time to visit with me today. Please don't hesitate to contact me if I can be of assistance to you.   Following are the goals we discussed today:   Goals Addressed               This Visit's Progress     COMPLETED: Care coordination activity (pt-stated)        Care Coordination Interventions: Reviewed medications with patient and discussed adherence with no needed refills Reviewed scheduled/upcoming provider appointments including sufficient transportation source Assessed social determinant of health barriers Provided information on available program and services related to care management along with assistance available with social workers and pharmacy if needed.         Please call the care guide team at [phone] if you need to cancel or reschedule your appointment.   If you are experiencing a Mental Health or [place] or need someone to talk to, please call the Suicide and Crisis Lifeline: 988 call the [place] National Suicide Prevention Lifeline: 408-009-1630 or TTY: 947 016 8226 TTY 937-592-5295) to talk to a trained counselor call 1-800-273-TALK (toll free, 24 hour hotline)  Patient verbalizes understanding of instructions and care plan provided today and agrees to view in [place]. Active MyChart status and patient understanding of how to access instructions and care plan via MyChart confirmed with patient.     No further follow up required: No further follow up needs  Raina Mina, RN Care Management Coordinator [place] [facility] [phone]

## 2022-06-10 NOTE — Patient Outreach (Signed)
  Care Coordination   Initial Visit Note   06/10/2022 Name: Carlos Costa MRN: [id number] DOB: [birth year]  Carlos Costa is a 51 y.o. year old male who sees Wendie Agreste, MD for primary care. I spoke with  Carlos Costa by phone today.  What matters to the patients health and wellness today?  No needs    Goals Addressed               This Visit's Progress     COMPLETED: Care coordination activity (pt-stated)        Care Coordination Interventions: Reviewed medications with patient and discussed adherence with no needed refills Reviewed scheduled/upcoming provider appointments including sufficient transportation source Assessed social determinant of health barriers Provided information on available program and services related to care management along with assistance available with social workers and pharmacy if needed.         SDOH assessments and interventions completed:  Yes  SDOH Interventions Today    Flowsheet Row Most Recent Value  SDOH Interventions   Housing Interventions Intervention Not Indicated  Transportation Interventions Intervention Not Indicated  Utilities Interventions Intervention Not Indicated        Care Coordination Interventions:  Yes, provided   Follow up plan: No further intervention required.   Encounter Outcome:  Pt. Visit Completed   Raina Mina, RN Care Management Coordinator [place] Office [phone]
